# Patient Record
Sex: Female | Born: 1990 | Race: Black or African American | Hispanic: No | Marital: Single | Smoking: Never smoker
Health system: Southern US, Community
[De-identification: ages and names within clinical notes are randomized; demographics above are authoritative.]

## PROBLEM LIST (undated history)

## (undated) DIAGNOSIS — F32A Depression, unspecified: Secondary | ICD-10-CM

## (undated) DIAGNOSIS — F909 Attention-deficit hyperactivity disorder, unspecified type: Secondary | ICD-10-CM

## (undated) DIAGNOSIS — F329 Major depressive disorder, single episode, unspecified: Secondary | ICD-10-CM

## (undated) HISTORY — PX: KNEE SURGERY: SHX244

---

## 2014-03-05 ENCOUNTER — Ambulatory Visit (INDEPENDENT_AMBULATORY_CARE_PROVIDER_SITE_OTHER): Payer: Managed Care, Other (non HMO)

## 2014-03-05 ENCOUNTER — Encounter: Payer: Self-pay | Admitting: Physician Assistant

## 2014-03-05 ENCOUNTER — Ambulatory Visit (INDEPENDENT_AMBULATORY_CARE_PROVIDER_SITE_OTHER): Payer: Managed Care, Other (non HMO) | Admitting: Physician Assistant

## 2014-03-05 ENCOUNTER — Other Ambulatory Visit: Payer: Self-pay | Admitting: Physician Assistant

## 2014-03-05 ENCOUNTER — Ambulatory Visit: Payer: Managed Care, Other (non HMO)

## 2014-03-05 VITALS — BP 114/73 | HR 77 | Ht 62.0 in | Wt 145.0 lb

## 2014-03-05 DIAGNOSIS — M25561 Pain in right knee: Secondary | ICD-10-CM

## 2014-03-05 DIAGNOSIS — M25569 Pain in unspecified knee: Secondary | ICD-10-CM

## 2014-03-05 DIAGNOSIS — S76119A Strain of unspecified quadriceps muscle, fascia and tendon, initial encounter: Secondary | ICD-10-CM

## 2014-03-05 MED ORDER — MELOXICAM 15 MG PO TABS: ORAL_TABLET | ORAL | Status: DC

## 2014-03-05 NOTE — Patient Instructions (Addendum)
Start mobic daily.  Out of Physical training for 2 weeks.  Will get set up with PT.  Follow up with Dr. Karie Schwalbe in 2 weeks.   Quadriceps Strain with Rehab A strain is a tear in a muscle or the tendon that attaches the muscle to bone. A quadriceps strain is a tear in the muscles on the front of the thigh (quadriceps muscles) or their tendons. The quadriceps muscles are important for straightening the knee and bending the hip. The condition is characterized by pain, inflammation, and reduced function of these muscles. Strains are classified into three categories. Grade 1 strains cause pain, but the tendon is not lengthened. Grade 2 strains include a lengthened ligament due to the ligament being stretched or partially ruptured. With grade 2 strains there is still function, although the function may be diminished. Grade 3 strains are characterized by a complete tear of the tendon or muscle, and function is usually impaired.  SYMPTOMS   Pain, tenderness, inflammation, and/or bruising (contusion) over the quadriceps muscles  Pain that worsens with use of the quadriceps muscles.  Muscle spasm in the thigh.  Difficulty with common tasks that involve the quadriceps muscle, such as walking.  A crackling sound (crepitation) when the tendon is moved or touched.  Loss of fullness of the muscle or bulging within the area of muscle with complete rupture. CAUSES  A strain occurs when a force is placed on the muscle or tendon that is greater than it can withstand. Common mechanisms of injury include:  Repetitive strenuous use of the quadriceps muscles. This may be due to an increase in the intensity, frequency, or duration of exercise.  Direct trauma to the quadriceps muscles or tendons. RISK INCREASES WITH:  Activities that involve forceful contractions of the quadriceps muscles (jumping or sprinting).  Contact sports (soccer or football).  Poor strength and flexibility.  Failure to warm-up properly  before activity.  Previous injury to the thigh or knee. PREVENTION  Warm up and stretch properly before activity.  Allow for adequate recovery between workouts.  Maintain physical fitness:  Strength, flexibility, and endurance.  Cardiovascular fitness.  Wear properly fitted and padded protective equipment. PROGNOSIS  If treated properly, then quadriceps muscles strains are usually curable within 6 weeks.  RELATED COMPLICATIONS   Prolonged healing time, if improperly treated or re-injured.  Recurrent symptoms that result in a chronic problem.  Recurrence of symptoms if activity is resumed too soon. TREATMENT  Treatment initially involves the use of ice and medication to help reduce pain and inflammation. The use of strengthening and stretching exercises may help reduce pain with activity. These exercises may be performed at home or with referral to a therapist. Crutches may be recommended to allow the muscle to rest until walking can be completed without limping. Surgery is rarely necessary for this injury, but may be considered if the injury involves a grade 3 strain, or if symptoms persist for greater than 3 months despite non-surgical (conservative) treatment.  MEDICATION  If pain medication is necessary, then nonsteroidal anti-inflammatory medications, such as aspirin and ibuprofen, or other minor pain relievers, such as acetaminophen, are often recommended.  Do not take pain medication for 7 days before surgery.  Prescription pain relievers may be given if deemed necessary by your caregiver. Use only as directed and only as much as you need.  Ointments applied to the skin may be helpful.  Corticosteroid injections may be given by your caregiver. These injections should be reserved for the most  serious cases, because they may only be given a certain number of times. HEAT AND COLD  Cold treatment (icing) relieves pain and reduces inflammation. Cold treatment should be  applied for 10 to 15 minutes every 2 to 3 hours for inflammation and pain and immediately after any activity that aggravates your symptoms. Use ice packs or massage the area with a piece of ice (ice massage).  Heat treatment may be used prior to performing the stretching and strengthening activities prescribed by your caregiver, physical therapist, or athletic trainer. Use a heat pack or soak the injury in warm water. SEEK MEDICAL CARE IF:  Treatment seems to offer no benefit, or the condition worsens.  Any medications produce adverse side effects. EXERCISES  RANGE OF MOTION (ROM) AND STRETCHING EXERCISES - Quadriceps Strain These exercises may help you when beginning to rehabilitate your injury. Your symptoms may resolve with or without further involvement from your physician, physical therapist or athletic trainer. While completing these exercises, remember:   Restoring tissue flexibility helps normal motion to return to the joints. This allows healthier, less painful movement and activity.  An effective stretch should be held for at least 30 seconds.  A stretch should never be painful. You should only feel a gentle lengthening or release in the stretched tissue. RANGE OF MOTION - Knee Flexion, Active  Lie on your back with both knees straight. (If this causes back discomfort, bend your opposite knee, placing your foot flat on the floor.)  Slowly slide your heel back toward your buttocks until you feel a gentle stretch in the front of your knee or thigh.  Hold for __________ seconds. Slowly slide your heel back to the starting position. Repeat __________ times. Complete this exercise __________ times per day.  STRETCH - Quadriceps, Prone  Lie on your stomach on a firm surface, such as a bed or padded floor.  Bend your right / left knee and grasp your ankle. If you are unable to reach, your ankle or pant leg, use a belt around your foot to lengthen your reach.  Gently pull your heel  toward your buttocks. Your knee should not slide out to the side. You should feel a stretch in the front of your thigh and/or knee.  Hold this position for __________ seconds. Repeat __________ times. Complete this stretch __________ times per day.  STRETCHING - Hip Flexors, Lunge  Half kneel with your right / left knee on the floor and your opposite knee bent and directly over your ankle.  Keep good posture with your head over your shoulders. Tighten your buttocks to point your tailbone downward; this will prevent your back from arching too much.  You should feel a gentle stretch in the front of your thigh and/or hip. If you do not feel any resistance, slightly slide your opposite foot forward and then slowly lunge forward so your knee once again lines up over your ankle. Be sure your tailbone remains pointed downward.  Hold this stretch for __________ seconds. Repeat __________ times. Complete this stretch __________ times per day. STRENGTHENING EXERCISES - Quadriceps Strain These exercises may help you when beginning to rehabilitate your injury. They may resolve your symptoms with or without further involvement from your physician, physical therapist or athletic trainer. While completing these exercises, remember:   Muscles can gain both the endurance and the strength needed for everyday activities through controlled exercises.  Complete these exercises as instructed by your physician, physical therapist or athletic trainer. Progress the resistance and repetitions  only as guided. STRENGTH - Quadriceps, Isometrics  Lie on your back with your right / left leg extended and your opposite knee bent.  Gradually tense the muscles in the front of your right / left thigh. You should see either your knee cap slide up toward your hip or increased dimpling just above the knee. This motion will push the back of the knee down toward the floor/mat/bed on which you are lying.  Hold the muscle as tight  as you can without increasing your pain for __________ seconds.  Relax the muscles slowly and completely in between each repetition. Repeat __________ times. Complete this exercise __________ times per day.  STRENGTH - Quadriceps, Short Arcs   Lie on your back. Place a __________ inch towel roll under your knee so that the knee slightly bends.  Raise only your lower leg by tightening the muscles in the front of your thigh. Do not allow your thigh to rise.  Hold this position for __________ seconds. Repeat __________ times. Complete this exercise __________ times per day.  OPTIONAL ANKLE WEIGHTS: Begin with ____________________, but DO NOT exceed ____________________. Increase in1 lb/0.5 kg increments. STRENGTH - Quadriceps, Straight Leg Raises  Quality counts! Watch for signs that the quadriceps muscle is working to insure you are strengthening the correct muscles and not "cheating" by substituting with healthier muscles.  Lay on your back with your right / left leg extended and your opposite knee bent.  Tense the muscles in the front of your right / left thigh. You should see either your knee cap slide up or increased dimpling just above the knee. Your thigh may even quiver.  Tighten these muscles even more and raise your leg 4 to 6 inches off the floor. Hold for __________ seconds.  Keeping these muscles tense, lower your leg.  Relax the muscles slowly and completely in between each repetition. Repeat __________ times. Complete this exercise __________ times per day.  STRENGTH - Quadriceps, Wall Slides  Follow guidelines for form closely. Increased knee pain often results from poorly placed feet or knees.  Lean against a smooth wall or door and walk your feet out 18-24 inches. Place your feet hip-width apart.  Slowly slide down the wall or door until your knees bend __________ degrees.* Keep your knees over your heels, not your toes, and in line with your hips, not falling to either  side.  Hold for __________ seconds. Stand up to rest for __________ seconds in between each repetition. Repeat __________ times. Complete this exercise __________ times per day. * Your physician, physical therapist or athletic trainer will alter this angle based on your symptoms and progress. STRENGTH - Quadriceps, Step-Ups   Use a thick book, step or step stool that is __________ inches tall.  Holding a wall or counter for balance only, not support.  Slowly step-up with your right / left foot, keeping your knee in line with your hip and foot. Do not allow your knee to bend so far that you cannot see your toes.  Slowly unlock your knee and lower yourself to the starting position. Your muscles, not gravity, should lower you. Repeat __________ times. Complete this exercise __________ times per day. Document Released: 10/22/2005 Document Revised: 01/14/2012 Document Reviewed: 02/03/2009 [facility] Patient Information 2014 [place], [place].

## 2014-03-08 DIAGNOSIS — S76119A Strain of unspecified quadriceps muscle, fascia and tendon, initial encounter: Secondary | ICD-10-CM | POA: Insufficient documentation

## 2014-03-08 DIAGNOSIS — M25561 Pain in right knee: Secondary | ICD-10-CM | POA: Insufficient documentation

## 2014-03-08 NOTE — Progress Notes (Signed)
   Subjective:    Patient ID: Nancy Olson, female    DOB: 04-09-1991, 23 y.o.   MRN: [id number]  HPI Pt is a 23 yo female who presents to the clinic to establish care.   . Active Ambulatory Problems    Diagnosis Date Noted  . No Active Ambulatory Problems   Resolved Ambulatory Problems    Diagnosis Date Noted  . No Resolved Ambulatory Problems   No Additional Past Medical History   .Marland Kitchen. Past Surgical History  Procedure Laterality Date  . Knee surgery Right    .Marland Kitchen. Family History  Problem Relation Age of Onset  . Sickle cell anemia Mother   . Sickle cell anemia Sister    .Marland Kitchen. History   Social History  . Marital Status: Single    Spouse Name: N/A    Number of Children: N/A  . Years of Education: N/A   Occupational History  . Not on file.   Social History Main Topics  . Smoking status: Never Smoker   . Smokeless tobacco: Not on file  . Alcohol Use: No  . Drug Use: No  . Sexual Activity: Not Currently   Other Topics Concern  . Not on file   Social History Narrative  . No narrative on file   Pt is in the [employer] and having to do a lot of running and physical training. She had surgery on her growth plate in her femur at 23 years old. She has always had some knee discomfort. Lately it has seem to worsening. No other known injury. Ibuprofen helps some. Does not seem to give way. Hurts worse with running and exercising. Unfortunately she is doing a lot of that currently with the [employer].    Review of Systems  All other systems reviewed and are negative.      Objective:   Physical Exam  Constitutional: She is oriented to person, place, and time. She appears well-developed and well-nourished.  HENT:  Head: Normocephalic and atraumatic.  Cardiovascular: Normal rate, regular rhythm and normal heart sounds.   Pulmonary/Chest: Effort normal and breath sounds normal. She has no wheezes.  Musculoskeletal:  Right knee:  NROM.  No swelling or brusing.   Strength 5/5.  Patellar reflexes 2+ and symmetric.  No pain with palpation over patella.  No joint pain to palpiation.  Pain with palpitation over medial and lateral quad muscle and over previous fracture site.  ACL, MCL, PCL, and LCL intact without laxity.  Negative mcmurrays.  Seemed to be some discomfort with apleys grind test.  Discomfort with vagus and valgus strain above knee located over quadricep muscles.    Neurological: She is alert and oriented to person, place, and time.  Skin: Skin is dry.  Psychiatric: She has a normal mood and affect. Her behavior is normal.          Assessment & Plan:  Right knee pain- Will get xrays today. Pain seems like more over quad muscle insertion points at knee. I gave home exercises to start and will get formal PT to strengthen quad muscles. I wrote pt out of PT this weekend for [employer]. I gave mobic to start for inflammation and pain daily. recommened stabilzing knee brace with running and exercise. Importance of stretching before activity was discussed. Follow up with Dr. Karie Schwalbe in 2-3 weeks for if not improving.     Needs CPE. Pt aware and will make appt.

## 2014-03-09 ENCOUNTER — Telehealth: Payer: Self-pay | Admitting: *Deleted

## 2014-03-09 NOTE — Telephone Encounter (Signed)
Pt left a message wanting to make sure that she is cleared to workout without using a knee brace.  She hasn't gotten one yet but wanted to know.

## 2014-03-09 NOTE — Telephone Encounter (Signed)
Ok to work out. If continues to cause pain then stop and follow up after 2 weeks or so of PT.

## 2014-03-10 NOTE — Telephone Encounter (Signed)
Pt.notified

## 2014-03-12 ENCOUNTER — Other Ambulatory Visit (HOSPITAL_COMMUNITY)
Admission: RE | Admit: 2014-03-12 | Discharge: 2014-03-12 | Disposition: A | Discharge status: Home / Self Care | Payer: Managed Care, Other (non HMO) | Source: Ambulatory Visit | Attending: Family Medicine | Admitting: Family Medicine

## 2014-03-12 ENCOUNTER — Encounter: Payer: Self-pay | Admitting: Physician Assistant

## 2014-03-12 ENCOUNTER — Ambulatory Visit (INDEPENDENT_AMBULATORY_CARE_PROVIDER_SITE_OTHER): Payer: Managed Care, Other (non HMO) | Admitting: Physician Assistant

## 2014-03-12 VITALS — BP 113/67 | HR 75 | Ht 63.25 in | Wt 147.0 lb

## 2014-03-12 DIAGNOSIS — Z13220 Encounter for screening for lipoid disorders: Secondary | ICD-10-CM

## 2014-03-12 DIAGNOSIS — L680 Hirsutism: Secondary | ICD-10-CM

## 2014-03-12 DIAGNOSIS — N760 Acute vaginitis: Secondary | ICD-10-CM | POA: Insufficient documentation

## 2014-03-12 DIAGNOSIS — Z131 Encounter for screening for diabetes mellitus: Secondary | ICD-10-CM

## 2014-03-12 DIAGNOSIS — Z1151 Encounter for screening for human papillomavirus (HPV): Secondary | ICD-10-CM | POA: Insufficient documentation

## 2014-03-12 DIAGNOSIS — Z0000 Encounter for general adult medical examination without abnormal findings: Secondary | ICD-10-CM

## 2014-03-12 DIAGNOSIS — Z113 Encounter for screening for infections with a predominantly sexual mode of transmission: Secondary | ICD-10-CM | POA: Insufficient documentation

## 2014-03-12 DIAGNOSIS — R87810 Cervical high risk human papillomavirus (HPV) DNA test positive: Secondary | ICD-10-CM | POA: Insufficient documentation

## 2014-03-12 DIAGNOSIS — Z124 Encounter for screening for malignant neoplasm of cervix: Secondary | ICD-10-CM | POA: Insufficient documentation

## 2014-03-12 DIAGNOSIS — Z23 Encounter for immunization: Secondary | ICD-10-CM

## 2014-03-12 MED ORDER — MELOXICAM 15 MG PO TABS: ORAL_TABLET | ORAL | Status: DC

## 2014-03-12 NOTE — Patient Instructions (Signed)

## 2014-03-13 LAB — LIPID PANEL
Cholesterol: 190 mg/dL (ref 0–200)
HDL: 67 mg/dL (ref >39)
LDL CALC: 109 mg/dL — AB (ref 0–99)
Total CHOL/HDL Ratio: 2.8 Ratio
Triglycerides: 69 mg/dL (ref <150)
VLDL: 14 mg/dL (ref 0–40)

## 2014-03-13 LAB — COMPLETE METABOLIC PANEL WITH GFR
ALT: 8 U/L (ref 0–35)
AST: 14 U/L (ref 0–37)
Albumin: 4 g/dL (ref 3.5–5.2)
Alkaline Phosphatase: 51 U/L (ref 39–117)
BILIRUBIN TOTAL: 0.4 mg/dL (ref 0.2–1.2)
BUN: 8 mg/dL (ref 6–23)
CO2: 26 meq/L (ref 19–32)
Calcium: 8.9 mg/dL (ref 8.4–10.5)
Chloride: 103 mEq/L (ref 96–112)
Creat: 0.73 mg/dL (ref 0.50–1.10)
GLUCOSE: 79 mg/dL (ref 70–99)
Potassium: 3.6 mEq/L (ref 3.5–5.3)
SODIUM: 138 meq/L (ref 135–145)
TOTAL PROTEIN: 7.1 g/dL (ref 6.0–8.3)

## 2014-03-15 ENCOUNTER — Encounter: Payer: Self-pay | Admitting: Sports Medicine

## 2014-03-15 ENCOUNTER — Ambulatory Visit (INDEPENDENT_AMBULATORY_CARE_PROVIDER_SITE_OTHER): Payer: Managed Care, Other (non HMO) | Admitting: Sports Medicine

## 2014-03-15 VITALS — BP 116/69 | HR 80 | Ht 62.0 in | Wt 149.0 lb

## 2014-03-15 DIAGNOSIS — M25569 Pain in unspecified knee: Secondary | ICD-10-CM

## 2014-03-15 DIAGNOSIS — M25561 Pain in right knee: Secondary | ICD-10-CM

## 2014-03-15 DIAGNOSIS — L680 Hirsutism: Secondary | ICD-10-CM | POA: Insufficient documentation

## 2014-03-15 LAB — TESTOSTERONE, FREE, TOTAL, SHBG
Sex Hormone Binding: 46 nmol/L (ref 18–114)
TESTOSTERONE FREE: 11.2 pg/mL — AB (ref 0.6–6.8)
TESTOSTERONE: 76 ng/dL — AB (ref 10–70)
Testosterone-% Free: 1.5 % (ref 0.4–2.4)

## 2014-03-15 NOTE — Progress Notes (Signed)
Patient ID: Nancy Olson, female   DOB: 1991-08-19, 23 y.o.   MRN: [id number]   Subjective:    I'm seeing this patient as a consultation for:  Tandy GawJade Breeback, PA-C  CC: Right knee pain  HPI:  Pt is a 23 yo woman with PMH of right femoral fracture, s/p ORIF as a child, who presents today with 1 month of right knee pain.  The pain began gradually and is achy in nature.  It occurs all throughout her knee, however is worse over the medial femoral condyle.  The pain is made worse with squatting, climbing stairs, long periods of sitting, and rising from sitting.  The pain is also a lot worse after long periods of activity.  She is in the [employer] and frequently goes on 6+ mile marches, which she states exacerbate her symptoms.  She denies any recent trauma, fever, chills, erythema over the joint. Does endorse some right knee swelling.   Past medical history, Surgical history, Family history not pertinant except as noted below, Social history, Allergies, and medications have been entered into the medical record, reviewed, and no changes needed.   Review of Systems: No headache, visual changes, nausea, vomiting, diarrhea, constipation, dizziness, abdominal pain, skin rash, fevers, chills, night sweats, weight loss, swollen lymph nodes, body aches, joint swelling, muscle aches, chest pain, shortness of breath, mood changes, visual or auditory hallucinations.   Objective:   General: Well Developed, well nourished, and in no acute distress.  Neuro/Psych: Alert and oriented x3, extra-ocular muscles intact, able to move all 4 extremities, sensation grossly intact. Skin: Warm and dry, no rashes noted.  Respiratory: Not using accessory muscles, speaking in full sentences, trachea midline.  Cardiovascular: Pulses palpable, no extremity edema. Abdomen: Does not appear distended. Right Knee: Normal to inspection with no erythema or effusion or obvious bony abnormalities. Point tenderness over  medial femoral condyle and under the medial patellar facet. ROM full in flexion and extension and lower leg rotation. Ligaments with solid consistent endpoints including ACL, PCL, LCL, MCL. Negative Mcmurray's, Apley's, and Thessalonian tests. Mildly painful patellar compression. Patellar glide without crepitus. Patellar and quadriceps tendons unremarkable. Hamstring and quadriceps strength is normal.   X-rays reviewed, overall unremarkable with the exception of what appears to be a slight abnormality along the lateral aspect of the medial femoral condyle on the LenoirRosenberg view.  Impression and Recommendations:   This case required medical decision making of moderate complexity.  This is an active 23 yo female with a PMH of femoral fracture who presents with 1 month diffuse knee pain worse with activity.  X-rays demonstrate that this is not a complication of her previous ORIF and implanted hardware malfunction.  However the x-ray did show what is suspicious for a minor deformity of the femoral condyle where the patient is endorsing the worst pain.  For this reason, the patient could have an osteochondral defect of the medial epicondyle although we cannot be 100% without advanced imaging.  She also endorses symptoms consistent with patellofemoral syndrome.  Will MRI knee for further evaluation given her 1 month history of failed conservative therapy.  - MRI right knee -continue mobic

## 2014-03-15 NOTE — Progress Notes (Signed)
  Subjective:     Nancy Olson is a 23 y.o. female and is here for a comprehensive physical exam. The patient reports problems - noticed hair growth under chin. Marland Kitchen.  History   Social History  . Marital Status: Single    Spouse Name: N/A    Number of Children: N/A  . Years of Education: N/A   Occupational History  . Not on file.   Social History Main Topics  . Smoking status: Never Smoker   . Smokeless tobacco: Not on file  . Alcohol Use: No  . Drug Use: No  . Sexual Activity: Not Currently   Other Topics Concern  . Not on file   Social History Narrative  . No narrative on file   Health Maintenance  Topic Date Due  . Influenza Vaccine  06/05/2014  . Pap Smear  03/12/2017  . Tetanus/tdap  03/12/2024    The following portions of the patient's history were reviewed and updated as appropriate: allergies, current medications, past family history, past medical history, past social history, past surgical history and problem list.  Review of Systems A comprehensive review of systems was negative.   Objective:    BP 113/67  Pulse 75  Ht 5' 3.25" (1.607 m)  Wt 147 lb (66.679 kg)  BMI 25.82 kg/m2  LMP 02/24/2014 General appearance: alert and cooperative Head: Normocephalic, without obvious abnormality, atraumatic Eyes: conjunctivae/corneas clear. PERRL, EOM's intact. Fundi benign. Ears: normal TM's and external ear canals both ears Nose: Nares normal. Septum midline. Mucosa normal. No drainage or sinus tenderness. Throat: lips, mucosa, and tongue normal; teeth and gums normal Neck: no adenopathy, no carotid bruit, no JVD, supple, symmetrical, trachea midline and thyroid not enlarged, symmetric, no tenderness/mass/nodules Back: symmetric, no curvature. ROM normal. No CVA tenderness. Lungs: clear to auscultation bilaterally Breasts: normal appearance, no masses or tenderness Heart: regular rate and rhythm, S1, S2 normal, no murmur, click, rub or gallop Abdomen: soft,  non-tender; bowel sounds normal; no masses,  no organomegaly Pelvic: cervix normal in appearance, external genitalia normal, no adnexal masses or tenderness, no cervical motion tenderness, uterus normal size, shape, and consistency and vagina normal without discharge Extremities: extremities normal, atraumatic, no cyanosis or edema Pulses: 1+ symetric Skin: Skin color, texture, turgor normal. No rashes or lesions Lymph nodes: Cervical, supraclavicular, and axillary nodes normal. Neurologic: Grossly normal    Assessment:    Healthy female exam.      Plan:    cpe-Pap done today with STD testing. Tdap given. Fasting labs ordered. Gave HO. Calcium and vitamin D were recommended.   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 Encouraged to wax and not shave. This was mentioned on the way out the door. Will make follow up appt.  See After Visit Summary for Counseling Recommendations

## 2014-03-15 NOTE — Assessment & Plan Note (Addendum)
There is a significant amount pain over the medial femoral condyle, I do see a defect on x-ray on the lateral aspect of the medial femoral condyle. Pain has also been present for a month despite NSAIDs. At this point it is difficult to tell whether her pain is related to patellofemoral syndrome versus a defect along the medial Femoral condyle such as an osteochondral dissecans lesion. We are going to proceed with an MRI, I like to see her back to go over the results. [employer] and I do not want her marching, she did tell me she has a pole dancing class coming up, which I think it is okay for her to do.

## 2014-03-16 ENCOUNTER — Other Ambulatory Visit: Payer: Self-pay | Admitting: Physician Assistant

## 2014-03-16 DIAGNOSIS — R7989 Other specified abnormal findings of blood chemistry: Secondary | ICD-10-CM

## 2014-03-16 DIAGNOSIS — L680 Hirsutism: Secondary | ICD-10-CM

## 2014-03-16 DIAGNOSIS — M25561 Pain in right knee: Secondary | ICD-10-CM

## 2014-03-18 ENCOUNTER — Other Ambulatory Visit: Payer: Self-pay

## 2014-03-18 ENCOUNTER — Telehealth: Payer: Self-pay | Admitting: *Deleted

## 2014-03-18 DIAGNOSIS — L680 Hirsutism: Secondary | ICD-10-CM

## 2014-03-18 DIAGNOSIS — E349 Endocrine disorder, unspecified: Secondary | ICD-10-CM

## 2014-03-18 NOTE — Telephone Encounter (Signed)
PA obtained for MRI RT Knee w/o contrast. Auth # [id number]. Also received [facility] from [facility]  Meyer CoryMisty Islay Polanco, LPN Pt informed to get disc when she goes for MRI and to bring it to her appt with her.  Meyer CoryMisty Burnell Matlin, LPN

## 2014-03-19 ENCOUNTER — Other Ambulatory Visit: Payer: Self-pay | Admitting: Physician Assistant

## 2014-03-19 DIAGNOSIS — R87612 Low grade squamous intraepithelial lesion on cytologic smear of cervix (LGSIL): Secondary | ICD-10-CM | POA: Insufficient documentation

## 2014-03-19 DIAGNOSIS — IMO0002 Reserved for concepts with insufficient information to code with codable children: Secondary | ICD-10-CM | POA: Insufficient documentation

## 2014-03-19 MED ORDER — METRONIDAZOLE 500 MG PO TABS: 500.0000 mg | ORAL_TABLET | Freq: Three times a day (TID) | ORAL | Status: DC

## 2014-03-23 ENCOUNTER — Telehealth: Payer: Self-pay | Admitting: *Deleted

## 2014-03-23 DIAGNOSIS — R7989 Other specified abnormal findings of blood chemistry: Secondary | ICD-10-CM

## 2014-03-24 ENCOUNTER — Encounter: Payer: Self-pay | Admitting: Sports Medicine

## 2014-03-26 ENCOUNTER — Ambulatory Visit: Payer: Managed Care, Other (non HMO) | Admitting: Sports Medicine

## 2014-03-30 ENCOUNTER — Ambulatory Visit: Payer: Managed Care, Other (non HMO) | Admitting: Sports Medicine

## 2014-04-05 ENCOUNTER — Ambulatory Visit: Payer: Managed Care, Other (non HMO) | Admitting: Sports Medicine

## 2014-04-06 ENCOUNTER — Encounter: Payer: Managed Care, Other (non HMO) | Admitting: Obstetrics & Gynecology

## 2014-04-21 ENCOUNTER — Encounter: Payer: Managed Care, Other (non HMO) | Admitting: Obstetrics & Gynecology

## 2014-04-22 ENCOUNTER — Encounter: Payer: Self-pay | Admitting: Sports Medicine

## 2014-04-22 ENCOUNTER — Telehealth: Payer: Self-pay | Admitting: Physician Assistant

## 2014-04-22 ENCOUNTER — Ambulatory Visit (INDEPENDENT_AMBULATORY_CARE_PROVIDER_SITE_OTHER): Payer: Managed Care, Other (non HMO) | Admitting: Sports Medicine

## 2014-04-22 VITALS — BP 98/62 | HR 89 | Ht 62.0 in | Wt 144.0 lb

## 2014-04-22 DIAGNOSIS — M25569 Pain in unspecified knee: Secondary | ICD-10-CM

## 2014-04-22 DIAGNOSIS — M25561 Pain in right knee: Secondary | ICD-10-CM

## 2014-04-22 NOTE — Progress Notes (Signed)
  Subjective:    CC: Followup  HPI: Right knee pain: Localized over the patella, initially we have a suspicion for patellofemoral chondromalacia, MRI subsequently was negative, but she continues to have pain under the kneecap, worse going up and down stairs, with squatting, moderate, persistent.  Past medical history, Surgical history, Family history not pertinant except as noted below, Social history, Allergies, and medications have been entered into the medical record, reviewed, and no changes needed.   Review of Systems: No fevers, chills, night sweats, weight loss, chest pain, or shortness of breath.   Objective:    General: Well Developed, well nourished, and in no acute distress.  Neuro: Alert and oriented x3, extra-ocular muscles intact, sensation grossly intact.  HEENT: Normocephalic, atraumatic, pupils equal round reactive to light, neck supple, no masses, no lymphadenopathy, thyroid nonpalpable.  Skin: Warm and dry, no rashes. Cardiac: Regular rate and rhythm, no murmurs rubs or gallops, no lower extremity edema.  Respiratory: Clear to auscultation bilaterally. Not using accessory muscles, speaking in full sentences. Right Knee: Normal to inspection with no erythema or effusion or obvious bony abnormalities. Tender to palpation over the medial and lateral patellar facets. ROM full in flexion and extension and lower leg rotation. Ligaments with solid consistent endpoints including ACL, PCL, LCL, MCL. Negative Mcmurray's, Apley's, and Thessalonian tests. Non painful patellar compression. Patellar glide without crepitus. Patellar and quadriceps tendons unremarkable. Hamstring and quadriceps strength is normal.   Knee MRI was reviewed and is negative.  Impression and Recommendations:

## 2014-04-22 NOTE — Telephone Encounter (Signed)
Message copied by Jomarie LongsBREEBACK, JADE L on Thu Apr 22, 2014 11:04 PM ------      Message from: Granville LewisLARK, LORA L      Created: Thu Apr 22, 2014  3:33 PM      Regarding: NO show       FYI pt no showed or no call for colposcopy. ------

## 2014-04-22 NOTE — Telephone Encounter (Signed)
Please call pt and find out why she no showed for colposcopy today?

## 2014-04-22 NOTE — Assessment & Plan Note (Signed)
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 Formal physical therapy for 6 weeks, return to see me afterwards.

## 2014-04-27 NOTE — Telephone Encounter (Signed)
Left voicemail waiting for call back. Corliss SkainsJamie Beyounce Dickens, CMA

## 2014-05-06 ENCOUNTER — Encounter: Payer: Managed Care, Other (non HMO) | Admitting: Obstetrics & Gynecology

## 2014-05-06 DIAGNOSIS — R87619 Unspecified abnormal cytological findings in specimens from cervix uteri: Secondary | ICD-10-CM

## 2014-09-03 ENCOUNTER — Ambulatory Visit: Payer: Managed Care, Other (non HMO) | Admitting: Physician Assistant

## 2014-12-15 ENCOUNTER — Ambulatory Visit (INDEPENDENT_AMBULATORY_CARE_PROVIDER_SITE_OTHER): Payer: Managed Care, Other (non HMO)

## 2014-12-15 ENCOUNTER — Ambulatory Visit (INDEPENDENT_AMBULATORY_CARE_PROVIDER_SITE_OTHER): Payer: Managed Care, Other (non HMO) | Admitting: Physician Assistant

## 2014-12-15 ENCOUNTER — Encounter: Payer: Self-pay | Admitting: Physician Assistant

## 2014-12-15 VITALS — BP 123/58 | HR 69 | Ht 62.0 in | Wt 150.0 lb

## 2014-12-15 DIAGNOSIS — M79671 Pain in right foot: Secondary | ICD-10-CM

## 2014-12-15 DIAGNOSIS — S86891A Other injury of other muscle(s) and tendon(s) at lower leg level, right leg, initial encounter: Secondary | ICD-10-CM

## 2014-12-15 MED ORDER — MELOXICAM 15 MG PO TABS: 15.0000 mg | ORAL_TABLET | Freq: Every day | ORAL | Status: DC

## 2014-12-15 NOTE — Patient Instructions (Addendum)
Good supportive shoes.  Medial Tibial Stress Syndrome (Shin Splints) with Rehab Medial tibial stress syndrome is also called shin splints. Shin splints is a term that is broadly used to describe pain in the lower leg. Shin splints most commonly involve inflammation of the bone lining (periostitis). SYMPTOMS   Pain in the front, or more commonly, the inner part of the lower half of the leg (shin), above the ankle.  Pain that first occurs after exercise, and eventually progresses to pain at the beginning of exercise, that decreases after a short warm up period.  With continued exercise and if left untreated, constant pain that eventually causes the athlete to stop playing sports. CAUSES  Shin splints are an overuse injury, in which the bone lining (periosteum) is broken down at a faster rate than it can be repaired. This leads to inflammation of the periosteum and pain.  RISK INCREASES WITH:  Weakness or imbalance of the muscles of the leg and calf.  Poor strength and flexibility. Failure to warm up properly before activity.  Sports that require repetitive loading or running (marathon running, soccer, walking, jogging), especially on uneven ground or hard surfaces (concrete).  Lack of conditioning, early in the season or practice.  Poor running technique.  Flat feet.  Sudden change in activity intensity, frequency, or duration. PREVENTION  Warm up and stretch properly before activity.  Allow for adequate recovery between workouts.  Maintain physical fitness:  Strength, flexibility, and endurance.  Cardiovascular fitness.  Ensure properly fitted and cushioned shoes.  Wear cushioned arch supports.  Learn and use proper technique and have a coach correct improper technique.  Increase activity gradually.  Run on surfaces that absorb shock, such as grass, composite track, or sand (beach). PROGNOSIS  If treated properly with a slow return to activity, shin splints usually  heal within 2 to 8 weeks.  RELATED COMPLICATIONS   Recurring symptoms, that result in a chronic problem.  Longer healing time, if not properly treated or if not given enough time to heal.  Altered level of performance or need to end sports participation, due to pain if activity is continued without treatment. TREATMENT Treatment first involves the use of ice and medicine, to reduce pain and inflammation. The use of strengthening and stretching exercises may help reduce pain with activity. These exercises may be performed at home or with a therapist. For individuals with flat feet, the use of arch supports (orthotics) may be helpful. Sometimes, taping, casting, or bracing the leg may be advised. Slow return to activity is allowed after pain is gone. Rarely, surgery is attempted to remove the chronically inflamed tissue.  MEDICATION  If pain medicine is needed, nonsteroidal anti-inflammatory medicines (aspirin and ibuprofen), or other minor pain relievers (acetaminophen), are often advised.  Do not take pain medicine for 7 days before surgery.  Prescription pain relievers may be given, if your caregiver thinks they are needed. Use only as directed and only as much as you need.  Ointments applied to the skin may be helpful. HEAT AND COLD  Cold treatment (icing) should be applied for 10 to 15 minutes every 2 to 3 hours for inflammation and pain, and immediately after activity that aggravates your symptoms. Use ice packs or an ice massage.  Heat treatment may be used before performing stretching and strengthening activities prescribed by your caregiver, physical therapist, or athletic trainer. Use a heat pack or a warm water soak. SEEK MEDICAL CARE IF:   Symptoms get worse or do not  improve in 4 to 6 weeks, despite treatment.  New, unexplained symptoms develop. (Drugs used in treatment may produce side effects.) EXERCISES RANGE OF MOTION (ROM) AND STRETCHING EXERCISES - Medial Tibial Stress  Syndrome (Shin Splints) These exercises may help you when beginning to rehabilitate your injury. Your symptoms may resolve with or without further involvement from your physician, physical therapist or athletic trainer. While completing these exercises, remember:   Restoring tissue flexibility helps normal motion to return to the joints. This allows healthier, less painful movement and activity.  An effective stretch should be held for at least 30 seconds.  A stretch should never be painful. You should only feel a gentle lengthening or release in the stretched tissue. STRETCH - Gastroc, Standing  Place your hands on a wall.  Extend your right / left leg behind you, keeping the front knee somewhat bent.  Slightly point your toes inward on your back foot.  Keeping your right / left heel on the floor and your knee straight, shift your weight toward the wall, not allowing your back to arch.  You should feel a gentle stretch in the right / left calf. Hold this position for __________ seconds. Repeat __________ times. Complete this stretch __________ times per day. STRETCH - Soleus, Standing   Place your hands on a wall.  Extend your right / left leg behind you, keeping the other knee somewhat bent.  Slightly point your toes inward on your back foot.  Keep your right / left heel on the floor, bend your back knee, and slightly shift your weight over the back leg so that you feel a gentle stretch deep in your back calf.  Hold this position for __________ seconds. Repeat __________ times. Complete this stretch __________ times per day. STRETCH - Gastrocsoleus, Standing  Note: This exercise can place a lot of stress on your foot and ankle. Please complete this exercise only if specifically instructed by your caregiver.   Place the ball of your right / left foot on a step, keeping your other foot firmly on the same step.  Hold on to the wall or a rail for balance.  Slowly lift your other  foot, allowing your body weight to press your heel down over the edge of the step.  You should feel a stretch in your right / left calf.  Hold this position for __________ seconds.  Repeat this exercise with a slight bend in your right / left knee. Repeat __________ times. Complete this stretch __________ times per day.  RANGE OF MOTION - Ankle Eversion   Sit with your right / left ankle crossed over your opposite knee.  Grip your foot with your opposite hand, placing your thumb on the top of your foot and your fingers across the bottom of your foot.  Gently push your foot downward with a slight rotation so your littlest toes rise slightly toward the ceiling.  You should feel a gentle stretch on the inside of your ankle. Hold the stretch for __________ seconds. Repeat __________ times. Complete this exercise __________ times per day.  RANGE OF MOTION - Ankle Inversion  Sit with your right / left ankle crossed over your opposite knee.  Grip your foot with your opposite hand, placing your thumb on the bottom of your foot and your fingers across the top of your foot.  Gently pull your foot so the smallest toe comes toward you and your thumb pushes the inside of the ball of your foot away from you.  You should feel a gentle stretch on the outside of your ankle. Hold the stretch for __________ seconds. Repeat __________ times. Complete this exercise __________ times per day.  RANGE OF MOTION- Ankle Plantar Flexion   Sit with your right / left leg crossed over your opposite knee.  Use your opposite hand to pull the top of your foot and toes toward you.  You should feel a gentle stretch on the top of your foot and ankle. Hold this position for __________ seconds. Repeat __________ times. Complete __________ times per day.  STRENGTHENING EXERCISES - Medial Tibial Stress Syndrome (Shin Splints) These exercises may help you when beginning to rehabilitate your injury. They may resolve your  symptoms with or without further involvement from your physician, physical therapist or athletic trainer. While completing these exercises, remember:   Muscles can gain both the endurance and the strength needed for everyday activities through controlled exercises.  Complete these exercises as instructed by your physician, physical therapist or athletic trainer. Increase the resistance and repetitions only as guided by your caregiver. STRENGTH - Dorsiflexors  Secure a rubber exercise band or tubing to a fixed object (table, pole) and loop the other end around your right / left foot.  Sit on the floor facing the fixed object. The band should be slightly tense when your foot is relaxed.  Slowly draw your foot back toward you, using your ankle and toes.  Hold this position for __________ seconds. Slowly release the tension in the band, return your foot to the starting position. Repeat __________ times. Complete this exercise __________ times per day.  STRENGTH - Towel Curls  Sit in a chair, on a non-carpeted surface.  Place your foot on a towel, keeping your heel on the floor.  Pull the towel toward your heel only by curling your toes. Keep your heel on the floor.  If instructed by your physician, physical therapist or athletic trainer, you may add weight at the end of the towel. Repeat __________ times. Complete this exercise __________ times per day. STRENGTH - Ankle Inversion  Secure one end of a rubber exercise band or tubing to a fixed object (table, pole). Loop the other end around your foot, just before your toes.  Place your fists between your knees. This will focus your strengthening at your ankle.  Slowly, pull your big toe up and in, making sure the band is positioned to resist the entire motion.  Hold this position for __________ seconds.  Have your muscles resist the band, as it slowly pulls your foot back to the starting position. Repeat __________ times. Complete this  exercises __________ times per day.  Document Released: 10/22/2005 Document Revised: 01/14/2012 Document Reviewed: 02/03/2009 [facility] Patient Information 2015 [place], [place]. This information is not intended to replace advice given to you by your health care provider. Make sure you discuss any questions you have with your health care provider.   Plantar Fasciitis (Heel Spur Syndrome) with Rehab The plantar fascia is a fibrous, ligament-like, soft-tissue structure that spans the bottom of the foot. Plantar fasciitis is a condition that causes pain in the foot due to inflammation of the tissue. SYMPTOMS   Pain and tenderness on the underneath side of the foot.  Pain that worsens with standing or walking. CAUSES  Plantar fasciitis is caused by irritation and injury to the plantar fascia on the underneath side of the foot. Common mechanisms of injury include:  Direct trauma to bottom of the foot.  Damage to a small  nerve that runs under the foot where the main fascia attaches to the heel bone.  Stress placed on the plantar fascia due to bone spurs. RISK INCREASES WITH:   Activities that place stress on the plantar fascia (running, jumping, pivoting, or cutting).  Poor strength and flexibility.  Improperly fitted shoes.  Tight calf muscles.  Flat feet.  Failure to warm-up properly before activity.  Obesity. PREVENTION  Warm up and stretch properly before activity.  Allow for adequate recovery between workouts.  Maintain physical fitness:  Strength, flexibility, and endurance.  Cardiovascular fitness.  Maintain a health body weight.  Avoid stress on the plantar fascia.  Wear properly fitted shoes, including arch supports for individuals who have flat feet. PROGNOSIS  If treated properly, then the symptoms of plantar fasciitis usually resolve without surgery. However, occasionally surgery is necessary. RELATED COMPLICATIONS   Recurrent symptoms that may result in a  chronic condition.  Problems of the lower back that are caused by compensating for the injury, such as limping.  Pain or weakness of the foot during push-off following surgery.  Chronic inflammation, scarring, and partial or complete fascia tear, occurring more often from repeated injections. TREATMENT  Treatment initially involves the use of ice and medication to help reduce pain and inflammation. The use of strengthening and stretching exercises may help reduce pain with activity, especially stretches of the Achilles tendon. These exercises may be performed at home or with a therapist. Your caregiver may recommend that you use heel cups of arch supports to help reduce stress on the plantar fascia. Occasionally, corticosteroid injections are given to reduce inflammation. If symptoms persist for greater than 6 months despite non-surgical (conservative), then surgery may be recommended.  MEDICATION   If pain medication is necessary, then nonsteroidal anti-inflammatory medications, such as aspirin and ibuprofen, or other minor pain relievers, such as acetaminophen, are often recommended.  Do not take pain medication within 7 days before surgery.  Prescription pain relievers may be given if deemed necessary by your caregiver. Use only as directed and only as much as you need.  Corticosteroid injections may be given by your caregiver. These injections should be reserved for the most serious cases, because they may only be given a certain number of times. HEAT AND COLD  Cold treatment (icing) relieves pain and reduces inflammation. Cold treatment should be applied for 10 to 15 minutes every 2 to 3 hours for inflammation and pain and immediately after any activity that aggravates your symptoms. Use ice packs or massage the area with a piece of ice (ice massage).  Heat treatment may be used prior to performing the stretching and strengthening activities prescribed by your caregiver, physical  therapist, or athletic trainer. Use a heat pack or soak the injury in warm water. SEEK IMMEDIATE MEDICAL CARE IF:  Treatment seems to offer no benefit, or the condition worsens.  Any medications produce adverse side effects. EXERCISES RANGE OF MOTION (ROM) AND STRETCHING EXERCISES - Plantar Fasciitis (Heel Spur Syndrome) These exercises may help you when beginning to rehabilitate your injury. Your symptoms may resolve with or without further involvement from your physician, physical therapist or athletic trainer. While completing these exercises, remember:   Restoring tissue flexibility helps normal motion to return to the joints. This allows healthier, less painful movement and activity.  An effective stretch should be held for at least 30 seconds.  A stretch should never be painful. You should only feel a gentle lengthening or release in the stretched tissue. RANGE  OF MOTION - Toe Extension, Flexion  Sit with your right / left leg crossed over your opposite knee.  Grasp your toes and gently pull them back toward the top of your foot. You should feel a stretch on the bottom of your toes and/or foot.  Hold this stretch for __________ seconds.  Now, gently pull your toes toward the bottom of your foot. You should feel a stretch on the top of your toes and or foot.  Hold this stretch for __________ seconds. Repeat __________ times. Complete this stretch __________ times per day.  RANGE OF MOTION - Ankle Dorsiflexion, Active Assisted  Remove shoes and sit on a chair that is preferably not on a carpeted surface.  Place right / left foot under knee. Extend your opposite leg for support.  Keeping your heel down, slide your right / left foot back toward the chair until you feel a stretch at your ankle or calf. If you do not feel a stretch, slide your bottom forward to the edge of the chair, while still keeping your heel down.  Hold this stretch for __________ seconds. Repeat __________  times. Complete this stretch __________ times per day.  STRETCH - Gastroc, Standing  Place hands on wall.  Extend right / left leg, keeping the front knee somewhat bent.  Slightly point your toes inward on your back foot.  Keeping your right / left heel on the floor and your knee straight, shift your weight toward the wall, not allowing your back to arch.  You should feel a gentle stretch in the right / left calf. Hold this position for __________ seconds. Repeat __________ times. Complete this stretch __________ times per day. STRETCH - Soleus, Standing  Place hands on wall.  Extend right / left leg, keeping the other knee somewhat bent.  Slightly point your toes inward on your back foot.  Keep your right / left heel on the floor, bend your back knee, and slightly shift your weight over the back leg so that you feel a gentle stretch deep in your back calf.  Hold this position for __________ seconds. Repeat __________ times. Complete this stretch __________ times per day. STRETCH - Gastrocsoleus, Standing  Note: This exercise can place a lot of stress on your foot and ankle. Please complete this exercise only if specifically instructed by your caregiver.   Place the ball of your right / left foot on a step, keeping your other foot firmly on the same step.  Hold on to the wall or a rail for balance.  Slowly lift your other foot, allowing your body weight to press your heel down over the edge of the step.  You should feel a stretch in your right / left calf.  Hold this position for __________ seconds.  Repeat this exercise with a slight bend in your right / left knee. Repeat __________ times. Complete this stretch __________ times per day.  STRENGTHENING EXERCISES - Plantar Fasciitis (Heel Spur Syndrome)  These exercises may help you when beginning to rehabilitate your injury. They may resolve your symptoms with or without further involvement from your physician, physical  therapist or athletic trainer. While completing these exercises, remember:   Muscles can gain both the endurance and the strength needed for everyday activities through controlled exercises.  Complete these exercises as instructed by your physician, physical therapist or athletic trainer. Progress the resistance and repetitions only as guided. STRENGTH - Towel Curls  Sit in a chair positioned on a non-carpeted surface.  Place your foot on a towel, keeping your heel on the floor.  Pull the towel toward your heel by only curling your toes. Keep your heel on the floor.  If instructed by your physician, physical therapist or athletic trainer, add ____________________ at the end of the towel. Repeat __________ times. Complete this exercise __________ times per day. STRENGTH - Ankle Inversion  Secure one end of a rubber exercise band/tubing to a fixed object (table, pole). Loop the other end around your foot just before your toes.  Place your fists between your knees. This will focus your strengthening at your ankle.  Slowly, pull your big toe up and in, making sure the band/tubing is positioned to resist the entire motion.  Hold this position for __________ seconds.  Have your muscles resist the band/tubing as it slowly pulls your foot back to the starting position. Repeat __________ times. Complete this exercises __________ times per day.  Document Released: 10/22/2005 Document Revised: 01/14/2012 Document Reviewed: 02/03/2009 [facility] Patient Information 2015 [place], [place]. This information is not intended to replace advice given to you by your health care provider. Make sure you discuss any questions you have with your health care provider.

## 2014-12-16 ENCOUNTER — Telehealth: Payer: Self-pay | Admitting: *Deleted

## 2014-12-16 DIAGNOSIS — S86899A Other injury of other muscle(s) and tendon(s) at lower leg level, unspecified leg, initial encounter: Secondary | ICD-10-CM | POA: Insufficient documentation

## 2014-12-16 DIAGNOSIS — M79671 Pain in right foot: Secondary | ICD-10-CM | POA: Insufficient documentation

## 2014-12-16 DIAGNOSIS — M79672 Pain in left foot: Secondary | ICD-10-CM

## 2014-12-16 NOTE — Telephone Encounter (Signed)
Pt left vm stating that her sgt is requesting a letter stating her dx, allowance for tennis shoes, limitations, and an expected timeline.  She also wanted to know what else she could do for her foot pain.  She stated that both of her feet are bothering her.

## 2014-12-16 NOTE — Telephone Encounter (Signed)
She can schedule appt with Dr. Karie Schwalbe for possible injection and orthotics.   DX right foot pain/plantar facsittis. Needs good supportive shoes. No running for 4 weeks. Unknown timeline 2-6 weeks.

## 2014-12-16 NOTE — Progress Notes (Signed)
   Subjective:    Patient ID: Nancy Olson, female    DOB: [birth year], 24 y.o.   MRN: [id number]  HPI Pt is a 24 yo female who presents to the clinic with right foot pain and right shin pain that has been getting progressevly worse over the past month. She is in the [employer] and earlier this month had to do a lot of running. Pain seems to radiate around right lateral ankle into her mid-foot. At rest no pain. Pain occurs with walking. She has done nothing to make better. Shin pain is only after running or walking long periods.    Review of Systems  All other systems reviewed and are negative.      Objective:   Physical Exam  Musculoskeletal:  No pain over right shin today.  Normal ROM of right foot.  No pain over dorsum of foot to palpation.  Pain plantarly to palpation over mid foot. Worse with doris and plantar flexion.  No swelling.  No pain over heel.           Assessment & Plan:  Right foot pain/shin splints- sounds like some plantar fascittis. Xray was normal of right foot. Discussed wearing good supportive shoes. mobic as needed. Exercises given for both. No running for next 2-4 weeks. Not for supportive shoes at work given. If not improving follow up with Dr. Karie Schwalbe for orthotics and possible injections.

## 2014-12-20 ENCOUNTER — Encounter: Payer: Self-pay | Admitting: *Deleted

## 2014-12-20 NOTE — Telephone Encounter (Signed)
Letter written.  Pt notified & stated she has an appt with Dr. Karie Schwalbe this coming Thursday.

## 2014-12-23 ENCOUNTER — Ambulatory Visit (INDEPENDENT_AMBULATORY_CARE_PROVIDER_SITE_OTHER): Payer: Managed Care, Other (non HMO) | Admitting: Sports Medicine

## 2014-12-23 ENCOUNTER — Encounter: Payer: Self-pay | Admitting: Sports Medicine

## 2014-12-23 VITALS — BP 95/55 | HR 76 | Ht 62.0 in | Wt 150.0 lb

## 2014-12-23 DIAGNOSIS — M79671 Pain in right foot: Secondary | ICD-10-CM

## 2014-12-23 DIAGNOSIS — M79672 Pain in left foot: Secondary | ICD-10-CM

## 2014-12-23 NOTE — Assessment & Plan Note (Signed)
Tenderness along the dorsal and volar fourth metatarsal shafts in a patient doing significant running and PT with the [employer] brings up the suspicion of stress fracture. She also has an arch strain. Custom orthotics as above. Return to see me in 4 weeks, we are going to put some limitations on her PT.

## 2014-12-23 NOTE — Progress Notes (Signed)
   Subjective:    I'm seeing this patient as a consultation for:  Tandy GawJade Breeback, PA-C  CC: bilateral foot pain  HPI: This 24 year old female comes in with bilateral foot pain, right worse than left, localized over the dorsum of the midfoot, and metatarsal shafts. She is in the [employer] and has been doing a lot of PT and running. Symptoms are moderate, persistent, she was referred to me predominantly for custom orthotics.  Past medical history, Surgical history, Family history not pertinant except as noted below, Social history, Allergies, and medications have been entered into the medical record, reviewed, and no changes needed.   Review of Systems: No headache, visual changes, nausea, vomiting, diarrhea, constipation, dizziness, abdominal pain, skin rash, fevers, chills, night sweats, weight loss, swollen lymph nodes, body aches, joint swelling, muscle aches, chest pain, shortness of breath, mood changes, visual or auditory hallucinations.   Objective:   General: Well Developed, well nourished, and in no acute distress.  Neuro/Psych: Alert and oriented x3, extra-ocular muscles intact, able to move all 4 extremities, sensation grossly intact. Skin: Warm and dry, no rashes noted.  Respiratory: Not using accessory muscles, speaking in full sentences, trachea midline.  Cardiovascular: Pulses palpable, no extremity edema. Abdomen: Does not appear distended. Bilateral feet: No visible erythema or swelling. Range of motion is full in all directions. Strength is 5/5 in all directions. No hallux valgus. No pes cavus or pes planus. No abnormal callus noted. No pain over the navicular prominence, or base of fifth metatarsal. No tenderness to palpation of the calcaneal insertion of plantar fascia. No pain at the Achilles insertion. No pain over the calcaneal bursa. No pain of the retrocalcaneal bursa. Extremely tender to palpation along the mid fourth metatarsal shaft bilaterally both on  the dorsal and plantar surfaces. No hallux rigidus or limitus. No tenderness palpation over interphalangeal joints. No pain with compression of the metatarsal heads. Neurovascularly intact distally.  Patient was fitted for a : standard, cushioned, semi-rigid orthotic. The orthotic was heated and afterward the patient stood on the orthotic blank positioned on the orthotic stand. The patient was positioned in subtalar neutral position and 10 degrees of ankle dorsiflexion in a weight bearing stance. After completion of molding, a stable base was applied to the orthotic blank. The blank was ground to a stable position for weight bearing. Size: 7 Base: [facility] Additional Posting and Padding: None The patient ambulated these, and they were very comfortable.  Impression and Recommendations:   This case required medical decision making of moderate complexity.

## 2015-01-21 ENCOUNTER — Ambulatory Visit (INDEPENDENT_AMBULATORY_CARE_PROVIDER_SITE_OTHER): Payer: Managed Care, Other (non HMO) | Admitting: Sports Medicine

## 2015-01-21 ENCOUNTER — Encounter: Payer: Self-pay | Admitting: Sports Medicine

## 2015-01-21 VITALS — BP 114/77 | HR 80 | Ht 62.0 in | Wt 151.0 lb

## 2015-01-21 DIAGNOSIS — M79671 Pain in right foot: Secondary | ICD-10-CM

## 2015-01-21 DIAGNOSIS — M79672 Pain in left foot: Secondary | ICD-10-CM

## 2015-01-21 NOTE — Progress Notes (Signed)
  Subjective:    CC: Follow-up after orthotics  HPI: For sometime now this 24 year old female 40has had bilateral foot pain, right worse than left, initially pain was over the dorsum of all of the metatarsals, now it's over the plantar aspect and worse with flexion of the toes. Symptoms are moderate, persistent. We built custom orthotics, and she has not had any improvement whatsoever. Initially she described pain particularly with drills, running, and marching with the [employer].  Past medical history, Surgical history, Family history not pertinant except as noted below, Social history, Allergies, and medications have been entered into the medical record, reviewed, and no changes needed.   Review of Systems: No fevers, chills, night sweats, weight loss, chest pain, or shortness of breath.   Objective:    General: Well Developed, well nourished, and in no acute distress.  Neuro: Alert and oriented x3, extra-ocular muscles intact, sensation grossly intact.  HEENT: Normocephalic, atraumatic, pupils equal round reactive to light, neck supple, no masses, no lymphadenopathy, thyroid nonpalpable.  Skin: Warm and dry, no rashes. Cardiac: Regular rate and rhythm, no murmurs rubs or gallops, no lower extremity edema.  Respiratory: Clear to auscultation bilaterally. Not using accessory muscles, speaking in full sentences. Both feet: Tender to palpation over the entirety of the foot in nearly all bony as well as tenderness and ligamentous structures. No visible swelling. Ambulates in with a nonantalgic gait.  Short leg cast placed on the right side.  Impression and Recommendations:

## 2015-01-21 NOTE — Assessment & Plan Note (Signed)
I do suspect there is an element of malingering here. Pain is out of proportion to exam, and visible appearance of the feet. Certainly we can get a diagnosis from an MRI however patient would desire to try cast first for a couple of weeks. We will try cast if no improvement we will MRI her right foot.

## 2015-02-07 ENCOUNTER — Encounter: Payer: Self-pay | Admitting: Sports Medicine

## 2015-02-07 ENCOUNTER — Ambulatory Visit (INDEPENDENT_AMBULATORY_CARE_PROVIDER_SITE_OTHER): Payer: Managed Care, Other (non HMO) | Admitting: Sports Medicine

## 2015-02-07 DIAGNOSIS — M79671 Pain in right foot: Secondary | ICD-10-CM | POA: Diagnosis not present

## 2015-02-07 DIAGNOSIS — M79672 Pain in left foot: Secondary | ICD-10-CM | POA: Diagnosis not present

## 2015-02-07 NOTE — Progress Notes (Signed)
  Subjective:    CC: recheck cast  HPI: This is a 24 year old female who comes in after 3 weeks of cast immobilization, for migratory dorsal mid foot pain on the right side. Initially she told us she had no pain, once cast was removed she tells me she had pain.  Past medical history, Surgical history, Family history not pertinant except as noted below, Social history, Allergies, and medications have been entered into the medical record, reviewed, and no changes needed.   Review of Systems: No fevers, chills, night sweats, weight loss, chest pain, or shortness of breath.   Objective:    General: Well Developed, well nourished, and in no acute distress.  Neuro: Alert and oriented x3, extra-ocular muscles intact, sensation grossly intact.  HEENT: Normocephalic, atraumatic, pupils equal round reactive to light, neck supple, no masses, no lymphadenopathy, thyroid nonpalpable.  Skin: Warm and dry, no rashes. Cardiac: Regular rate and rhythm, no murmurs rubs or gallops, no lower extremity edema.  Respiratory: Clear to auscultation bilaterally. Not using accessory muscles, speaking in full sentences.  Cast is removed, afterwards there was relatively diffuse tenderness significantly out of proportion to degree of palpation and foot examination.  Impression and Recommendations:

## 2015-02-07 NOTE — Assessment & Plan Note (Signed)
Cast is removed today. Still with pain over the metatarsals. Appearance of foot has been normal and pain has been migratory. For this reason I do suspect an element of malingering to get out of PT. At this point we are going to MRI her foot, we will discuss results over the phone.

## 2015-02-09 ENCOUNTER — Telehealth: Payer: Self-pay | Admitting: Sports Medicine

## 2015-02-09 NOTE — Telephone Encounter (Signed)
[facility] stating the Patient would like to get her MRI done there since it would be cheaper based on her insurance's list of preferred locations. Faxed over signed MRI order and insurance authorization information for imaging to be completed.

## 2015-02-09 NOTE — Telephone Encounter (Signed)
Preesh! 

## 2015-05-17 ENCOUNTER — Encounter: Payer: Managed Care, Other (non HMO) | Admitting: Family Medicine

## 2015-05-27 ENCOUNTER — Ambulatory Visit (INDEPENDENT_AMBULATORY_CARE_PROVIDER_SITE_OTHER): Payer: Managed Care, Other (non HMO) | Admitting: Family Medicine

## 2015-05-27 ENCOUNTER — Encounter: Payer: Self-pay | Admitting: Family Medicine

## 2015-05-27 VITALS — BP 107/68 | HR 79 | Ht 62.0 in | Wt 147.0 lb

## 2015-05-27 DIAGNOSIS — R41840 Attention and concentration deficit: Secondary | ICD-10-CM

## 2015-05-27 NOTE — Progress Notes (Signed)
   Subjective:    Patient ID: Nancy Olson, female    DOB: Jul 28, 1991, 24 y.o.   MRN: [id number]  HPI She has been having easily distracted her whole life. Says not it is affecting her job and her relationships. Says she didn't do well in school but she feels like she is intelligent.  She was never tested for ADHD as a child.  Sometimes she says something or does something that she later regrets.  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  She has a hard time following through on task.  She says she is very hard on herself.     Review of Systems     Objective:   Physical Exam  Constitutional: She is oriented to person, place, and time. She appears well-developed and well-nourished.  HENT:  Head: Normocephalic and atraumatic.  Neurological: She is alert and oriented to person, place, and time.  Psychiatric: She has a normal mood and affect. Her behavior is normal.          Assessment & Plan:  ADHD- patient completed a dull ADHD- RSV-4 with adult prompts questionnaire. Her score was 50. She screened positive. We had a discussion about getting her formally evaluated for a dull ADHD over the [facility]. I prefer to have a formal evaluation on the chart before starting a chronic stimulant medication and we discussed this today. She seemed upset that I did not go ahead and write a stimulant medication for her today. I told her that we would try to get her in as soon as possible. Most appointments take a couple of weeks but it should not take much longer than that.

## 2015-06-01 IMAGING — CR DG KNEE COMPLETE 4+V*R*
4 series · 4 of 4 positions shown · non-contrast
Comparison: None.

CLINICAL DATA: Right knee pain.  History of fracture.

EXAM:
RIGHT KNEE - COMPLETE 4+ VIEW

[view not recorded (1 of 4)]
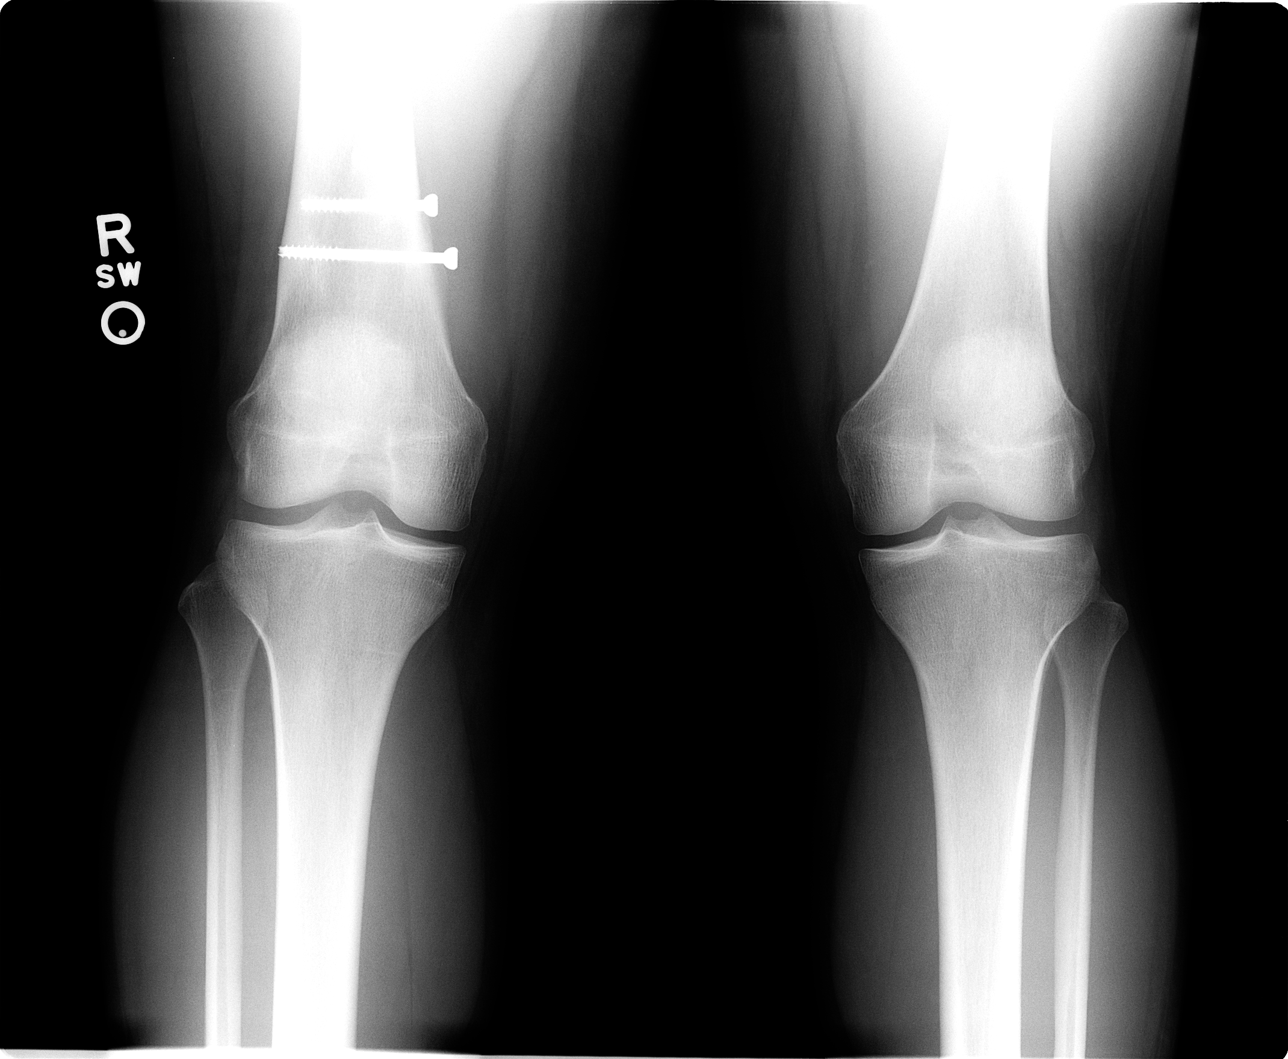

[view not recorded (2 of 4)]
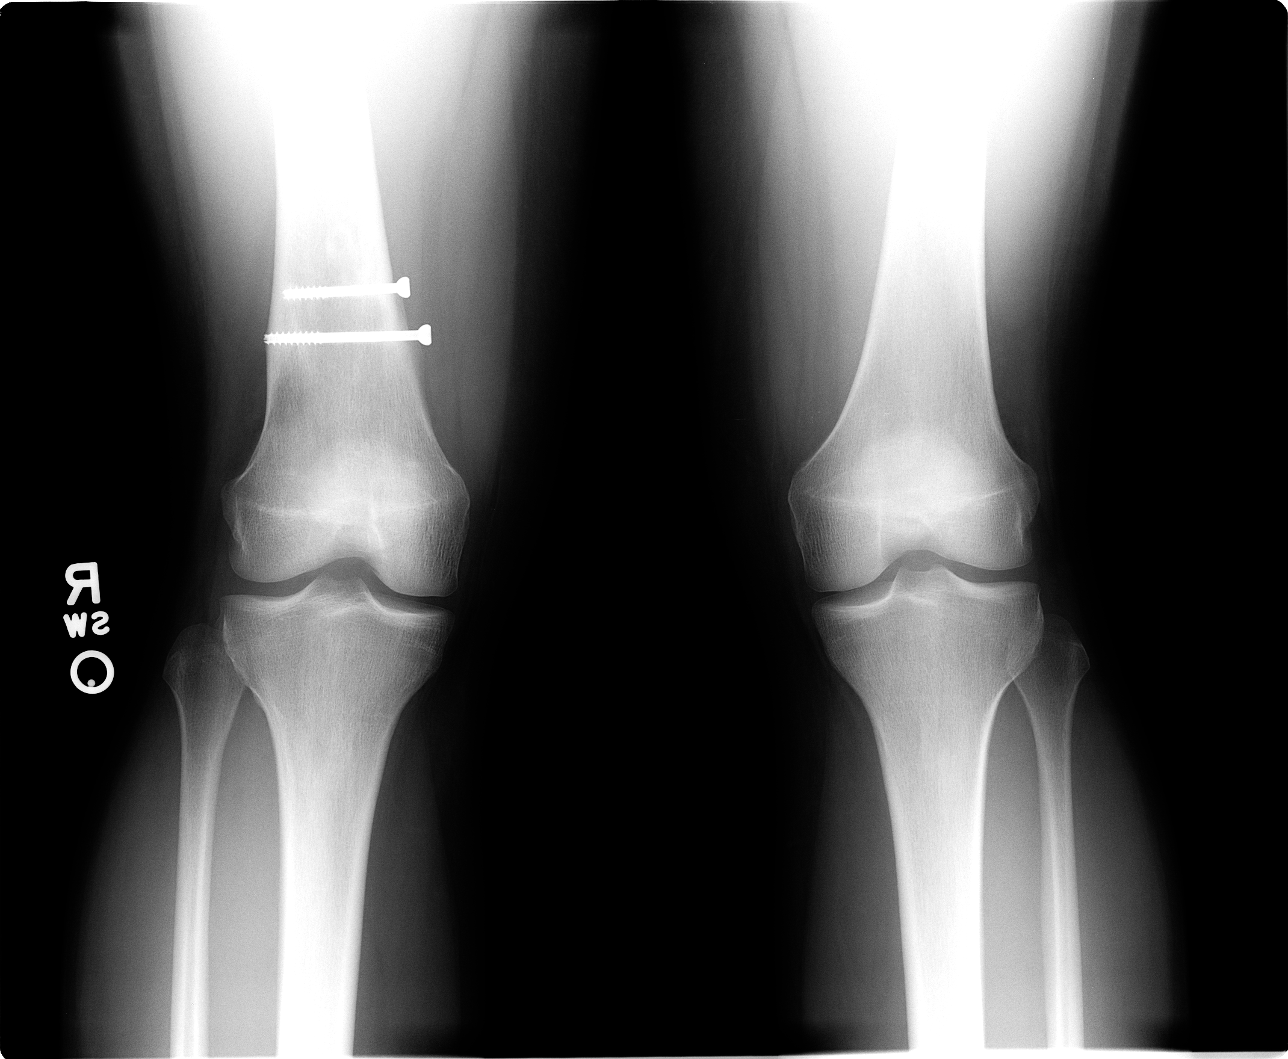

[view not recorded (3 of 4)]
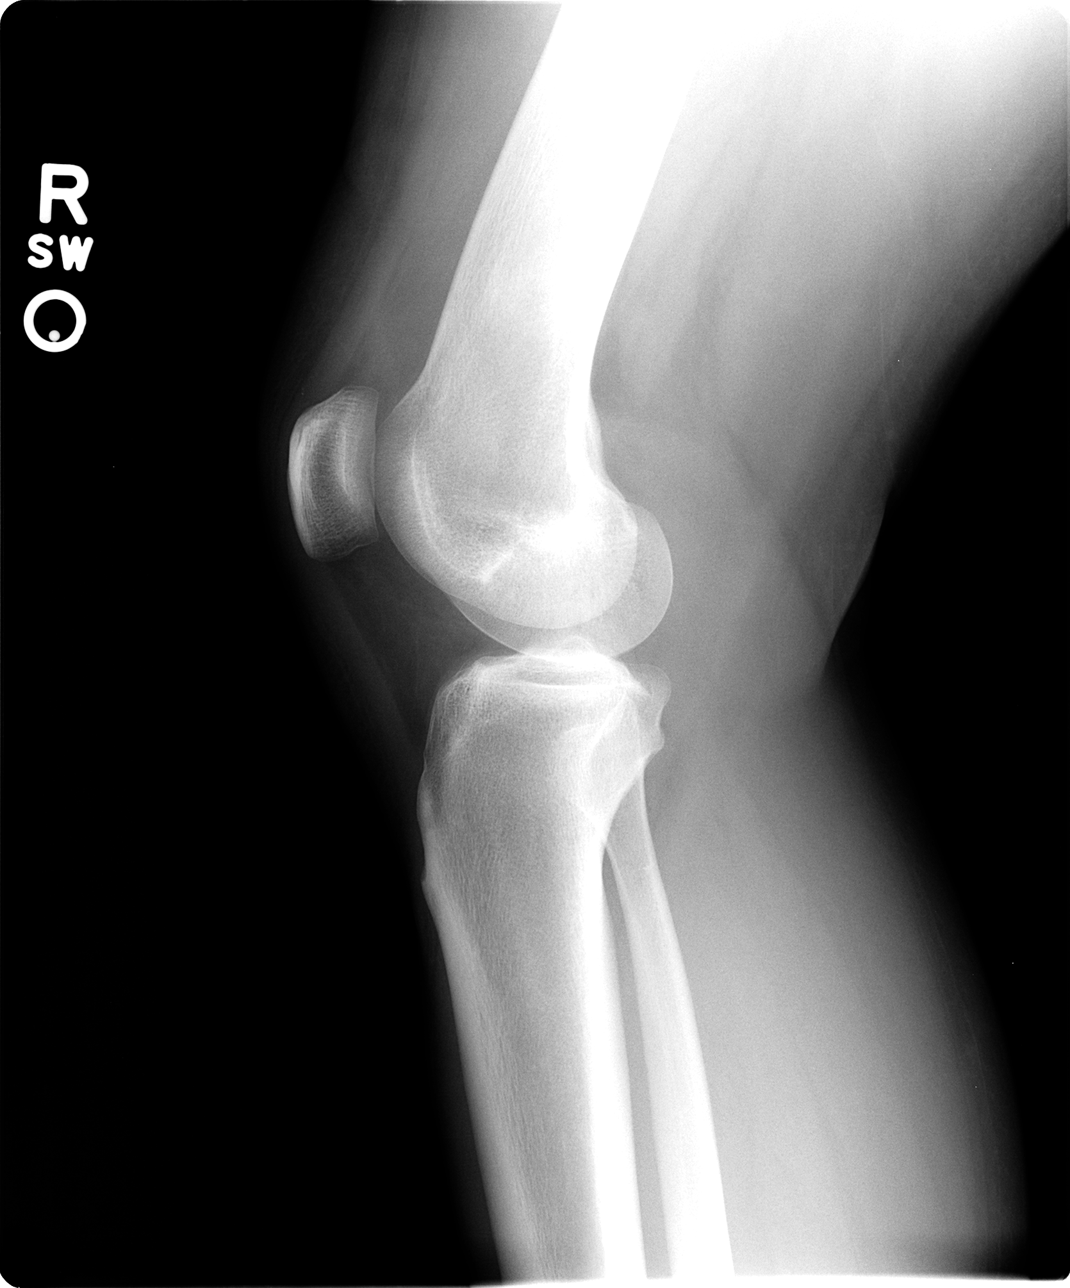

[view not recorded (4 of 4)]
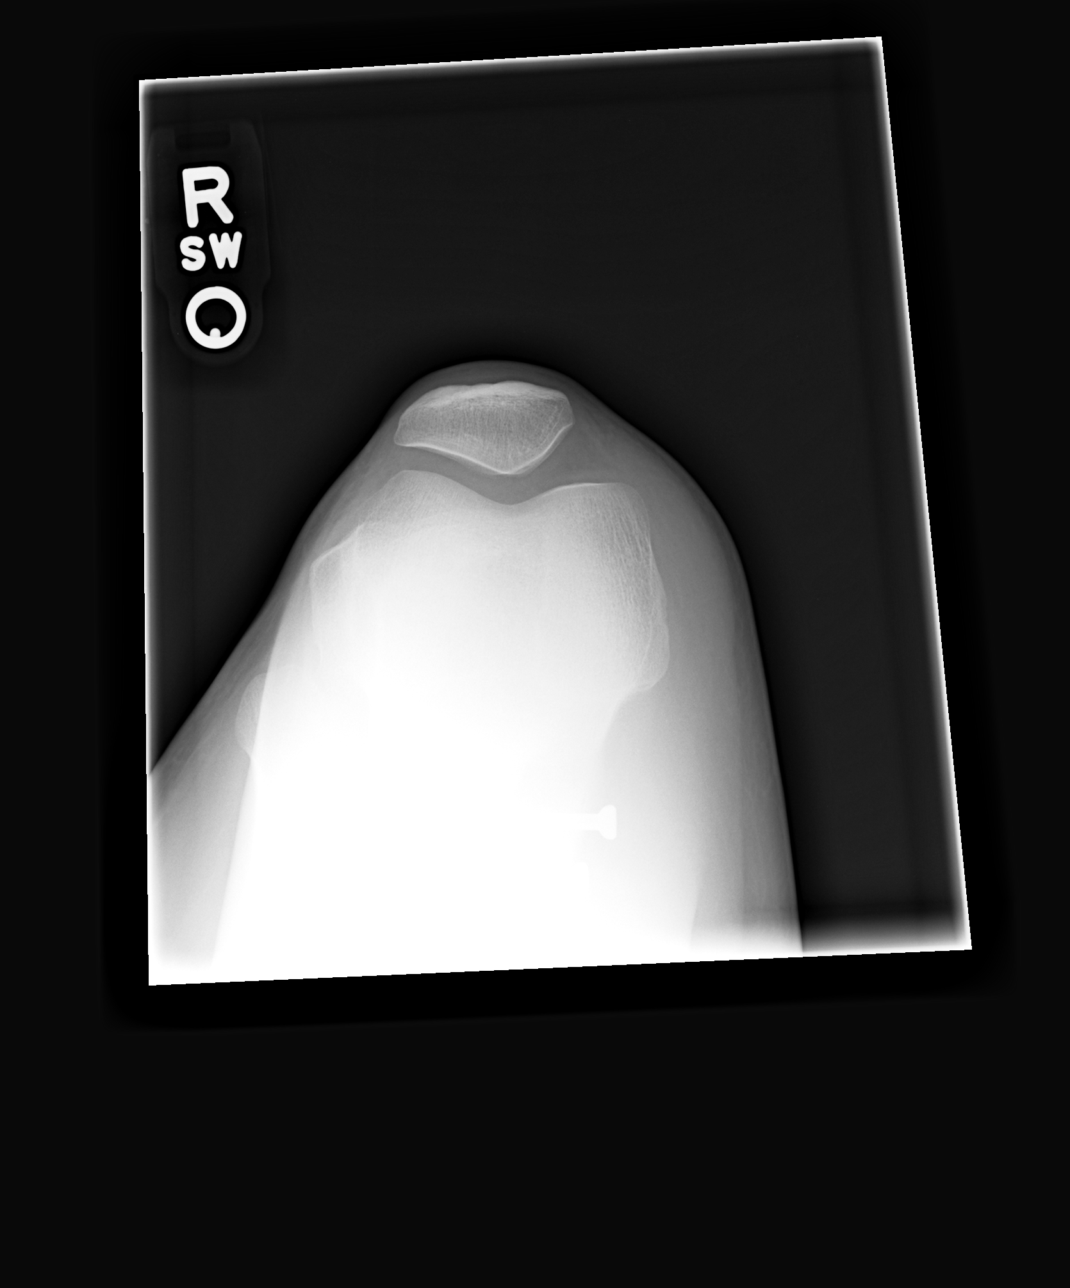

[4 of 4 positions shown; findings below may reference images not displayed]

FINDINGS: Two screws are seen in the distal femur for fracture fixation. The
fracture is healed in anatomic position and alignment. The knee
appears normal without fracture, dislocation or focal bony lesion.
Joint spaces are maintained. There is no joint effusion.
IMPRESSION: Normal appearing right knee.

Status post fixation of a distal right femur fracture without
evidence of complication.

## 2015-06-07 ENCOUNTER — Ambulatory Visit: Payer: Managed Care, Other (non HMO) | Admitting: Sports Medicine

## 2015-06-27 ENCOUNTER — Ambulatory Visit (INDEPENDENT_AMBULATORY_CARE_PROVIDER_SITE_OTHER): Payer: Managed Care, Other (non HMO) | Admitting: Psychology

## 2015-06-27 DIAGNOSIS — F4312 Post-traumatic stress disorder, chronic: Secondary | ICD-10-CM

## 2015-07-13 ENCOUNTER — Ambulatory Visit (INDEPENDENT_AMBULATORY_CARE_PROVIDER_SITE_OTHER): Payer: Managed Care, Other (non HMO) | Admitting: Psychology

## 2015-07-13 DIAGNOSIS — F4310 Post-traumatic stress disorder, unspecified: Secondary | ICD-10-CM

## 2015-07-13 DIAGNOSIS — F902 Attention-deficit hyperactivity disorder, combined type: Secondary | ICD-10-CM

## 2015-07-13 DIAGNOSIS — F331 Major depressive disorder, recurrent, moderate: Secondary | ICD-10-CM | POA: Diagnosis not present

## 2015-07-21 ENCOUNTER — Ambulatory Visit (INDEPENDENT_AMBULATORY_CARE_PROVIDER_SITE_OTHER): Payer: Managed Care, Other (non HMO) | Admitting: Psychology

## 2015-07-21 DIAGNOSIS — F331 Major depressive disorder, recurrent, moderate: Secondary | ICD-10-CM

## 2015-07-21 DIAGNOSIS — F902 Attention-deficit hyperactivity disorder, combined type: Secondary | ICD-10-CM

## 2015-07-21 DIAGNOSIS — F4310 Post-traumatic stress disorder, unspecified: Secondary | ICD-10-CM | POA: Diagnosis not present

## 2015-07-22 ENCOUNTER — Encounter: Payer: Self-pay | Admitting: Physician Assistant

## 2015-07-22 ENCOUNTER — Ambulatory Visit (INDEPENDENT_AMBULATORY_CARE_PROVIDER_SITE_OTHER): Payer: Managed Care, Other (non HMO) | Admitting: Physician Assistant

## 2015-07-22 VITALS — BP 103/58 | HR 67 | Ht 62.0 in | Wt 153.0 lb

## 2015-07-22 DIAGNOSIS — F902 Attention-deficit hyperactivity disorder, combined type: Secondary | ICD-10-CM | POA: Diagnosis not present

## 2015-07-22 DIAGNOSIS — F331 Major depressive disorder, recurrent, moderate: Secondary | ICD-10-CM

## 2015-07-22 DIAGNOSIS — F4310 Post-traumatic stress disorder, unspecified: Secondary | ICD-10-CM

## 2015-07-22 MED ORDER — AMPHETAMINE-DEXTROAMPHET ER 20 MG PO CP24: 20.0000 mg | ORAL_CAPSULE | ORAL | Status: DC

## 2015-07-22 MED ORDER — CITALOPRAM HYDROBROMIDE 10 MG PO TABS: 10.0000 mg | ORAL_TABLET | Freq: Every day | ORAL | Status: DC

## 2015-07-24 NOTE — Progress Notes (Signed)
   Subjective:    Patient ID: Nancy Olson, female    DOB: Mar 31, 1991, 24 y.o.   MRN: [id number]  HPI Pt presents to the clinic to follow up after psychology evaluation by [place]. She was dx with MDD, PTSD, and ADHD, combined type. She comes here to get a treatment plan.     Review of Systems  All other systems reviewed and are negative.      Objective:   Physical Exam  Constitutional: She appears well-developed and well-nourished.  Cardiovascular: Normal rate, regular rhythm and normal heart sounds.   Pulmonary/Chest: Effort normal and breath sounds normal.  Psychiatric: She has a normal mood and affect. Her behavior is normal.          Assessment & Plan:  MDD/PTSD- she is actively in counseling. Will start celexa today. Discussed SE's. Will take some time to get to therapetic levels. Follow up in 4-6 weeks.   ADHD- started Adderall XR  daily. Discussed SE's. Follow up in one month to check efficacy.

## 2015-08-04 ENCOUNTER — Ambulatory Visit: Payer: Managed Care, Other (non HMO) | Admitting: Psychology

## 2015-08-19 ENCOUNTER — Other Ambulatory Visit: Payer: Self-pay | Admitting: Physician Assistant

## 2015-08-19 MED ORDER — AMPHETAMINE-DEXTROAMPHET ER 20 MG PO CP24: 20.0000 mg | ORAL_CAPSULE | ORAL | Status: DC

## 2015-08-23 ENCOUNTER — Ambulatory Visit: Payer: Self-pay | Admitting: Physician Assistant

## 2015-08-24 ENCOUNTER — Emergency Department (HOSPITAL_COMMUNITY): Payer: Managed Care, Other (non HMO)

## 2015-08-24 ENCOUNTER — Encounter (HOSPITAL_COMMUNITY): Payer: Self-pay | Admitting: *Deleted

## 2015-08-24 ENCOUNTER — Emergency Department (HOSPITAL_COMMUNITY)
Admission: EM | Admit: 2015-08-24 | Discharge: 2015-08-24 | Disposition: A | Discharge status: Home / Self Care | Payer: Managed Care, Other (non HMO) | Attending: Emergency Medicine | Admitting: Emergency Medicine

## 2015-08-24 DIAGNOSIS — Z3202 Encounter for pregnancy test, result negative: Secondary | ICD-10-CM | POA: Insufficient documentation

## 2015-08-24 DIAGNOSIS — I493 Ventricular premature depolarization: Secondary | ICD-10-CM

## 2015-08-24 DIAGNOSIS — Z79899 Other long term (current) drug therapy: Secondary | ICD-10-CM | POA: Insufficient documentation

## 2015-08-24 DIAGNOSIS — R0789 Other chest pain: Secondary | ICD-10-CM | POA: Diagnosis not present

## 2015-08-24 DIAGNOSIS — R008 Other abnormalities of heart beat: Secondary | ICD-10-CM | POA: Diagnosis not present

## 2015-08-24 DIAGNOSIS — R002 Palpitations: Secondary | ICD-10-CM | POA: Diagnosis not present

## 2015-08-24 DIAGNOSIS — R079 Chest pain, unspecified: Secondary | ICD-10-CM | POA: Diagnosis present

## 2015-08-24 LAB — CBC WITH DIFFERENTIAL/PLATELET
Basophils Absolute: 0 10*3/uL (ref 0.0–0.1)
Basophils Relative: 1 %
Eosinophils Absolute: 0.1 10*3/uL (ref 0.0–0.7)
Eosinophils Relative: 2 %
HEMATOCRIT: 37.2 % (ref 36.0–46.0)
HEMOGLOBIN: 13 g/dL (ref 12.0–15.0)
LYMPHS ABS: 1.7 10*3/uL (ref 0.7–4.0)
Lymphocytes Relative: 34 %
MCH: 28.2 pg (ref 26.0–34.0)
MCHC: 34.9 g/dL (ref 30.0–36.0)
MCV: 80.7 fL (ref 78.0–100.0)
MONOS PCT: 5 %
Monocytes Absolute: 0.2 10*3/uL (ref 0.1–1.0)
NEUTROS ABS: 3 10*3/uL (ref 1.7–7.7)
NEUTROS PCT: 58 %
Platelets: 283 10*3/uL (ref 150–400)
RBC: 4.61 MIL/uL (ref 3.87–5.11)
RDW: 12.3 % (ref 11.5–15.5)
WBC: 5.1 10*3/uL (ref 4.0–10.5)

## 2015-08-24 LAB — BASIC METABOLIC PANEL
ANION GAP: 10 (ref 5–15)
BUN: 6 mg/dL (ref 6–20)
CO2: 27 mmol/L (ref 22–32)
Calcium: 9.8 mg/dL (ref 8.9–10.3)
Chloride: 102 mmol/L (ref 101–111)
Creatinine, Ser: 0.96 mg/dL (ref 0.44–1.00)
GLUCOSE: 114 mg/dL — AB (ref 65–99)
POTASSIUM: 3.4 mmol/L — AB (ref 3.5–5.1)
Sodium: 139 mmol/L (ref 135–145)

## 2015-08-24 LAB — I-STAT BETA HCG BLOOD, ED (MC, WL, AP ONLY): I-stat hCG, quantitative: <5 m[IU]/mL (ref <5)

## 2015-08-24 LAB — CBG MONITORING, ED: GLUCOSE-CAPILLARY: 129 mg/dL — AB (ref 65–99)

## 2015-08-24 LAB — I-STAT TROPONIN, ED: TROPONIN I, POC: 0 ng/mL (ref 0.00–0.08)

## 2015-08-24 NOTE — ED Notes (Signed)
[facility] Pt states she began having a "weird" feeling in her chest that felt like galloping accompanied with tightness. Pt CBG was 53 when EMS got there and they gave her oral glucose PTA and states her CBG went up to 89. Pt is a&o x4 upon arrival, pt is answering slowly and states it is due to the tightness in her chest.

## 2015-08-24 NOTE — ED Provider Notes (Signed)
CSN: [id number]     Arrival date & time 08/24/15  1630 History   First MD Initiated Contact with Patient 08/24/15 1631     Chief Complaint  Patient presents with  . Chest Pain     (Consider location/radiation/quality/duration/timing/severity/associated sxs/prior Treatment) HPI   Blood pressure 126/61, pulse 78, temperature 98.6 F (37 C), temperature source Oral, resp. rate 15, height  (1.575 m), weight 138 lb (62.596 kg), SpO2 100 %.  Nancy Olson is a 24 y.o. female complaining of chest tightness and palpitations onset just prior to arrival. Patient called 911, when EMS arrived her blood glucose was noted to be 53. Patient reports she's been having similar episodes over the last few weeks but normally they go away. She has recently started taking Adderall 20 mg X are approximately one month ago also Celexa. Patient denies any cocaine, methamphetamine use, family history of early cardiac death, exogenous estrogen, recent immobilizations, calf pain or leg swelling cough, fever, chills, suicidal ideation, homicidal ideation, taking more than her normal dose of Adderall.  History reviewed. No pertinent past medical history. Past Surgical History  Procedure Laterality Date  . Knee surgery Right    Family History  Problem Relation Age of Onset  . Sickle cell anemia Mother   . Sickle cell anemia Sister    Social History  Substance Use Topics  . Smoking status: Never Smoker   . Smokeless tobacco: None  . Alcohol Use: No   OB History    No data available     Review of Systems  10 systems reviewed and found to be negative, except as noted in the HPI.   Allergies  Review of patient's allergies indicates no known allergies.  Home Medications   Prior to Admission medications   Medication Sig Start Date End Date Taking? Authorizing Provider  amphetamine-dextroamphetamine (ADDERALL XR) 20 MG 24 hr capsule Take 1 capsule (20 mg total) by mouth every morning. 08/19/15  Yes  Monica Becton, MD  citalopram (CELEXA) 10 MG tablet Take 1 tablet (10 mg total) by mouth daily. 07/22/15  Yes Jade L Breeback, PA-C   BP 126/61 mmHg  Pulse 78  Temp(Src) 98.6 F (37 C) (Oral)  Resp 15  Ht  (1.575 m)  Wt 138 lb (62.596 kg)  BMI 25.23 kg/m2  SpO2 100% Physical Exam  Constitutional: She is oriented to person, place, and time. She appears well-developed and well-nourished. No distress.  HENT:  Head: Normocephalic.  Mouth/Throat: Oropharynx is clear and moist.  Eyes: Conjunctivae are normal.  Neck: Normal range of motion. No JVD present. No tracheal deviation present.  Cardiovascular: Normal rate, regular rhythm and intact distal pulses.   Radial pulse equal bilaterally  Pulmonary/Chest: Effort normal and breath sounds normal. No stridor. No respiratory distress. She has no wheezes. She has no rales. She exhibits no tenderness.  Abdominal: Soft. She exhibits no distension and no mass. There is no tenderness. There is no rebound and no guarding.  Musculoskeletal: Normal range of motion. She exhibits no edema or tenderness.  No calf asymmetry, superficial collaterals, palpable cords, edema, Homans sign negative bilaterally.    Neurological: She is alert and oriented to person, place, and time. No cranial nerve deficit.  Patient is slow to respond, seems distracted. Alert and oriented 3.  Follows commands, Clear, goal oriented speech, Strength is 5 out of 5x4 extremities, patient ambulates with a coordinated in nonantalgic gait. Sensation is grossly intact.   Skin: Skin is warm. She is  not diaphoretic.  Psychiatric: She has a normal mood and affect.  Nursing note and vitals reviewed.   ED Course  Procedures (including critical care time) Labs Review Labs Reviewed  BASIC METABOLIC PANEL - Abnormal; Notable for the following:    Potassium 3.4 (*)    Glucose, Bld 114 (*)    All other components within normal limits  CBG MONITORING, ED - Abnormal; Notable  for the following:    Glucose-Capillary 129 (*)    All other components within normal limits  CBC WITH DIFFERENTIAL/PLATELET  I-STAT BETA HCG BLOOD, ED ([facility], [facility], [facility] ONLY)  I-STAT TROPOININ, ED    Imaging Review Dg Chest 2 View  08/24/2015  CLINICAL DATA:  Chest pain EXAM: CHEST  2 VIEW COMPARISON:  None. FINDINGS: The heart size and mediastinal contours are within normal limits. Both lungs are clear. The visualized skeletal structures are unremarkable. IMPRESSION: No active cardiopulmonary disease. Electronically Signed   By: Natasha MeadLiviu  Pop M.D.   On: 08/24/2015 17:37   I have personally reviewed and evaluated these images and lab results as part of my medical decision-making.   EKG Interpretation None      MDM   Final diagnoses:  Atypical chest pain  Frequent unifocal PVCs    Filed Vitals:   08/24/15 1636 08/24/15 1645 08/24/15 1646 08/24/15 1647  BP:  126/61    Pulse:  78    Temp:    98.6 F (37 C)  TempSrc:    Oral  Resp:  15    Height:   5\' 2"  (1.575 m)   Weight:   138 lb (62.596 kg)   SpO2: 100% 100%      Nancy Olson is 24 y.o. female presenting with atypical chest pain. Low risk by heart score. EKG with many  PVCs. Chest x-ray negative. Pt is low risk by Wells Criteria and PERC negative. I think this may be secondary to her Adderall. Advised her to DC this, to not undergo any physical activity until she is cleared by her primary care physician who is managing the Adderall.  Discussed case with attending physician who agrees with care plan and disposition.   Evaluation does not show pathology that would require ongoing emergent intervention or inpatient treatment. Pt is hemodynamically stable and mentating appropriately. Discussed findings and plan with patient/guardian, who agrees with care plan. All questions answered. Return precautions discussed and outpatient follow up given.       Wynetta Emeryicole Jagjit Riner, PA-C 08/24/15 1928  Mancel BaleElliott Wentz, MD 08/25/15 2348

## 2015-08-24 NOTE — Discharge Instructions (Signed)
Stop taking your  Adderall, please follow closely with your primary care doctor and make them aware that you're having a side effect. They will need to manage his medication and fine-tune the dosage.  Do not go into any sporting activity until you're cleared by her primary care physician.  Please follow with your primary care doctor in the next 2 days for a check-up. They must obtain records for further management.   Do not hesitate to return to the Emergency Department for any new, worsening or concerning symptoms.

## 2015-08-24 NOTE — ED Notes (Signed)
Pt is in stable condition upon d/c and ambulates from ED. 

## 2015-08-26 ENCOUNTER — Encounter: Payer: Self-pay | Admitting: Physician Assistant

## 2015-08-26 ENCOUNTER — Ambulatory Visit (INDEPENDENT_AMBULATORY_CARE_PROVIDER_SITE_OTHER): Payer: Managed Care, Other (non HMO) | Admitting: Physician Assistant

## 2015-08-26 VITALS — BP 109/63 | HR 80 | Ht 62.0 in | Wt 139.0 lb

## 2015-08-26 DIAGNOSIS — R0789 Other chest pain: Secondary | ICD-10-CM | POA: Diagnosis not present

## 2015-08-26 DIAGNOSIS — I493 Ventricular premature depolarization: Secondary | ICD-10-CM

## 2015-08-26 NOTE — Patient Instructions (Addendum)
Premature Ventricular Contraction A premature ventricular contraction is an irregularity in the normal heart rhythm. These contractions are extra heartbeats that occur too early in the normal sequence. In most cases, these contractions are harmless and do not require treatment. CAUSES Premature ventricular contractions may occur without a known cause. In healthy people, the extra contractions may be caused by:  Smoking.  Drinking alcohol.  Caffeine.  Certain medicines.  Some illegal drugs.  Stress. Sometimes, changes in chemicals in the blood (electrolytes) can also cause premature ventricular contractions. They can also occur in people with heart diseases that cause a decrease in blood flow to the heart. SIGNS AND SYMPTOMS Premature ventricular contractions often do not cause any symptoms. In some cases, you may have a feeling of your heart beating fast or skipping a beat (palpitations). DIAGNOSIS Your health care provider will take your medical history and do a physical exam. During the exam, the health care provider will check for irregular heartbeats. Various tests may be done to help diagnose premature ventricular contractions. These tests may include:  An ECG (electrocardiogram) to monitor the electrical activity of your heart.  Holter monitor testing. A Holter monitor is a portable device that can monitor the electrical activity of your heart over longer periods of time.  Stress tests to see how exercise affects your heart rhythm.  Echocardiogram. This test uses sound waves (ultrasound) to produce an image of your heart.  Electrophysiology study. This is used to evaluate the electrical conduction system of your heart. TREATMENT Usually, no treatment is needed. You may be advised to avoid things that can trigger the premature contractions, such as caffeine or alcohol. Medicines are sometimes given if symptoms are severe or if the extra heartbeats are very frequent. Treatment may  also be needed for an underlying cause of the contractions if one is found. HOME CARE INSTRUCTIONS  Take medicines only as directed by your health care provider.  Make any lifestyle changes recommended by your health care provider. These may include:  Quitting smoking.  Avoiding or limiting caffeine or alcohol.  Exercising. Talk to your health care provider about what type of exercise is safe for you.  Trying to reduce stress.  Keep all follow-up visits with your health care provider. This is important. SEEK IMMEDIATE MEDICAL CARE IF:  You feel palpitations that are frequent or continual.  You have chest pain.  You have shortness of breath.  You have sweating for no reason.  You have nausea and vomiting.  You become light-headed or faint.   This information is not intended to replace advice given to you by your health care provider. Make sure you discuss any questions you have with your health care provider.   Document Released: 06/08/2004 Document Revised: 11/12/2014 Document Reviewed: 03/25/2014 Elsevier Interactive Patient Education [employer]2016 Elsevier Inc.   Will order stress test and follow up after.

## 2015-08-28 DIAGNOSIS — I493 Ventricular premature depolarization: Secondary | ICD-10-CM | POA: Insufficient documentation

## 2015-08-28 DIAGNOSIS — R0789 Other chest pain: Secondary | ICD-10-CM | POA: Insufficient documentation

## 2015-08-28 NOTE — Progress Notes (Signed)
   Subjective:    Patient ID: Nancy Olson, female    DOB: 25-Jul-1991, 24 y.o.   MRN: [id number]  HPI Pt presents to the clinic to follow up from ED with chest pains. EKG showed PVC's. Cardiac enzymes were negative. CXR was normal. Potassium was 3.4.  Suspected PVC from adderall. CP are better today. Continues to take adderall and celexa. No new symptoms. She occasional feels like worsens with exertion.    Review of Systems  All other systems reviewed and are negative.      Objective:   Physical Exam  Constitutional: She is oriented to person, place, and time. She appears well-developed and well-nourished.  HENT:  Head: Normocephalic and atraumatic.  Cardiovascular: Normal rate, regular rhythm and normal heart sounds.   Pulmonary/Chest: Effort normal and breath sounds normal.  Neurological: She is alert and oriented to person, place, and time.  Psychiatric: She has a normal mood and affect. Her behavior is normal.          Assessment & Plan:  PVC/atypical chest pains- discussed chest pains. Pt declined for now. Stop adderall. Continue celexa. Check TsH, cbc, iron. Recheck BMP for potassium level. Recheck in 2-4 weeks. Can consider adding back in stimulant once symptoms resolve. If start back up with stimulant will not be able to prescribe. Written out of physical actvity until 4 week recheck.

## 2015-09-06 ENCOUNTER — Telehealth: Payer: Self-pay | Admitting: Physician Assistant

## 2015-09-06 NOTE — Telephone Encounter (Signed)
Patient states she is having some shortness of breath and did have some dull chest pain when she went walking yesterday. Pt wants to wait one week of being off the Adderall and see if the symptoms get any better. Advised her to contact our office next week and let us know if she would like to proceed with the echo. Verbalized understanding.  Pt states for this weekend it should be just shooting, using heavy guns but no physical activity. Advised that should be fine. Pt states she has a note already from PCP to get her out of the required PT. No further questions.

## 2015-09-06 NOTE — Telephone Encounter (Signed)
Pt called clinic to see why she had not been scheduled for her echo yet. Will route to PCP for review, unable to see order.  Pt also states she has been off the adderall but is still having palpitations. 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 Pt wants to see if that is OK for her to do. Will route to PCP for review.

## 2015-09-06 NOTE — Telephone Encounter (Signed)
Ok I was under impression we were holding off on further testing to see if staying off stimulant would improve symptoms. If she would like to proceed with testing we can.   Are you having CP or SOB?   Shooting should be fine. it is running I would right you out of work for until further evaluated.

## 2015-09-09 ENCOUNTER — Ambulatory Visit (INDEPENDENT_AMBULATORY_CARE_PROVIDER_SITE_OTHER): Payer: Managed Care, Other (non HMO) | Admitting: Physician Assistant

## 2015-09-09 ENCOUNTER — Encounter: Payer: Self-pay | Admitting: Physician Assistant

## 2015-09-09 ENCOUNTER — Ambulatory Visit: Payer: Managed Care, Other (non HMO) | Admitting: Psychology

## 2015-09-09 VITALS — BP 99/61 | HR 75 | Ht 62.0 in | Wt 138.0 lb

## 2015-09-09 DIAGNOSIS — F902 Attention-deficit hyperactivity disorder, combined type: Secondary | ICD-10-CM

## 2015-09-09 DIAGNOSIS — I493 Ventricular premature depolarization: Secondary | ICD-10-CM

## 2015-09-09 DIAGNOSIS — F331 Major depressive disorder, recurrent, moderate: Secondary | ICD-10-CM

## 2015-09-09 DIAGNOSIS — F411 Generalized anxiety disorder: Secondary | ICD-10-CM | POA: Insufficient documentation

## 2015-09-09 DIAGNOSIS — R0789 Other chest pain: Secondary | ICD-10-CM | POA: Diagnosis not present

## 2015-09-09 MED ORDER — BUPROPION HCL ER (XL) 150 MG PO TB24: 150.0000 mg | ORAL_TABLET | Freq: Every day | ORAL | Status: DC

## 2015-09-09 MED ORDER — CITALOPRAM HYDROBROMIDE 20 MG PO TABS: 20.0000 mg | ORAL_TABLET | Freq: Every day | ORAL | Status: DC

## 2015-09-09 MED ORDER — ALPRAZOLAM 0.5 MG PO TABS: 0.5000 mg | ORAL_TABLET | Freq: Every evening | ORAL | Status: DC | PRN

## 2015-09-09 MED ORDER — METOPROLOL SUCCINATE ER 25 MG PO TB24: 25.0000 mg | ORAL_TABLET | Freq: Every day | ORAL | Status: DC

## 2015-09-09 NOTE — Progress Notes (Signed)
   Subjective:    Patient ID: Nancy Olson, female    DOB: 01-02-1991, 24 y.o.   MRN: [id number]  HPI  Patient is a 24 year old female who presents to the clinic to discuss her palpitations and worsening depression and anxiety. Yesterday about 4:30 she got off work and was uncontrollably crying. She does not know why she was crying. But as she cries her palpitations felt like they got worse. She continues to feel the palpitations and chest pain up until 6:30 this morning. She checked her heart rate and was 110 at times. She does feel a little benefit since we started the Celexa but feels like it is nowhere near enough. She denies any suicidal or homicidal thoughts. She has drilled this weekend and does not think she will be able to go because she so emotional and continues to have the palpitations. Patient did feel like she felt better when on the Adderall. We have taken her off because of the fear that it might be making her palpitations worse.      Review of Systems  All other systems reviewed and are negative.      Objective:   Physical Exam  Constitutional: She is oriented to person, place, and time. She appears well-developed and well-nourished.  HENT:  Head: Normocephalic and atraumatic.  Cardiovascular: Normal rate and normal heart sounds.   Few PVC heard on auscultation of heart today.   Pulmonary/Chest: Effort normal and breath sounds normal.  Tenderness to palpation over Left chest wall muscle.   Neurological: She is alert and oriented to person, place, and time.  Skin: Skin is dry.  Psychiatric: She has a normal mood and affect. Her behavior is normal.          Assessment & Plan:  Major Depression/ anxiety- increase Celexa to 20 mg daily. Added Wellbutrin 150 mg. Given Xanax 0.5 mg to use as needed for acute anxiety. Discussed Xanax is a controlled substance and to only use very sparingly as needed. Continue to talk with a counselor to work your way through anxiety  and depression.   ADHD- I do not want to restart stimulant at this time. We did decide to start Wellbutrin XL 150 mg once in the morning. Discussed side effect of medication. Hopefully this will offer some decrease in anxiety/depression/lack of focus.  Unifocal PvC's/atypical chest pain- will evaluate with exercise stress test and 48 hour Holter monitor. I did write patient out of drilled this weekend while we get further testing on her heart. Started metoprolol 25 mg daily. Discussed side effects. Recheck in 6 weeks.

## 2015-09-15 ENCOUNTER — Ambulatory Visit: Payer: Managed Care, Other (non HMO) | Admitting: Psychology

## 2015-09-19 ENCOUNTER — Other Ambulatory Visit: Payer: Self-pay | Admitting: Physician Assistant

## 2015-09-19 ENCOUNTER — Telehealth: Payer: Self-pay | Admitting: Physician Assistant

## 2015-09-19 ENCOUNTER — Inpatient Hospital Stay (HOSPITAL_COMMUNITY): Admission: RE | Admit: 2015-09-19 | Payer: Self-pay | Source: Ambulatory Visit

## 2015-09-19 DIAGNOSIS — I493 Ventricular premature depolarization: Secondary | ICD-10-CM

## 2015-09-19 DIAGNOSIS — R0789 Other chest pain: Secondary | ICD-10-CM

## 2015-09-19 NOTE — Telephone Encounter (Signed)
Jade    I received a call from [facility] stating that they could not do the Exercise Tolerant Test because you are not a cardiologist and the patient has not seen a cardiologist yet. I called patient and informed her that they would like for her to see a cardiologist before she has the test done and that [facility] to schedule her holter monitor. Patient was ok with all this and I told her once she had the monitor that we would probably reschedule the ETT. - CF

## 2015-09-21 ENCOUNTER — Telehealth: Payer: Self-pay

## 2015-09-21 NOTE — Telephone Encounter (Signed)
This could be the wellbutrin since we just started it? Stop wellbutrin and take benadryl 25-50mg  to help with hives. If any SOB, trouble swallowing, lip swelling follow up in office.

## 2015-09-21 NOTE — Telephone Encounter (Signed)
Patient advised.

## 2015-09-21 NOTE — Telephone Encounter (Signed)
Patient called and wants Lesly RubensteinJade to know she has a new symptom. She has broken out with hives and it is very itchy.

## 2015-09-28 ENCOUNTER — Encounter (HOSPITAL_COMMUNITY): Payer: Self-pay | Admitting: Emergency Medicine

## 2015-09-28 ENCOUNTER — Emergency Department (HOSPITAL_COMMUNITY)
Admission: EM | Admit: 2015-09-28 | Discharge: 2015-09-28 | Disposition: A | Discharge status: Home / Self Care | Payer: Managed Care, Other (non HMO) | Attending: Emergency Medicine | Admitting: Emergency Medicine

## 2015-09-28 DIAGNOSIS — R079 Chest pain, unspecified: Secondary | ICD-10-CM | POA: Insufficient documentation

## 2015-09-28 DIAGNOSIS — R55 Syncope and collapse: Secondary | ICD-10-CM | POA: Diagnosis present

## 2015-09-28 DIAGNOSIS — Z79899 Other long term (current) drug therapy: Secondary | ICD-10-CM | POA: Diagnosis not present

## 2015-09-28 DIAGNOSIS — R42 Dizziness and giddiness: Secondary | ICD-10-CM | POA: Diagnosis not present

## 2015-09-28 DIAGNOSIS — Z3202 Encounter for pregnancy test, result negative: Secondary | ICD-10-CM | POA: Diagnosis not present

## 2015-09-28 DIAGNOSIS — F329 Major depressive disorder, single episode, unspecified: Secondary | ICD-10-CM | POA: Diagnosis not present

## 2015-09-28 DIAGNOSIS — R0602 Shortness of breath: Secondary | ICD-10-CM | POA: Insufficient documentation

## 2015-09-28 HISTORY — DX: Major depressive disorder, single episode, unspecified: F32.9

## 2015-09-28 HISTORY — DX: Depression, unspecified: F32.A

## 2015-09-28 HISTORY — DX: Attention-deficit hyperactivity disorder, unspecified type: F90.9

## 2015-09-28 LAB — CBC
HCT: 34.3 % — ABNORMAL LOW (ref 36.0–46.0)
HEMOGLOBIN: 11.6 g/dL — AB (ref 12.0–15.0)
MCH: 27.9 pg (ref 26.0–34.0)
MCHC: 33.8 g/dL (ref 30.0–36.0)
MCV: 82.5 fL (ref 78.0–100.0)
Platelets: 242 10*3/uL (ref 150–400)
RBC: 4.16 MIL/uL (ref 3.87–5.11)
RDW: 12.7 % (ref 11.5–15.5)
WBC: 4.4 10*3/uL (ref 4.0–10.5)

## 2015-09-28 LAB — BASIC METABOLIC PANEL
ANION GAP: 6 (ref 5–15)
BUN: <5 mg/dL — ABNORMAL LOW (ref 6–20)
CALCIUM: 9 mg/dL (ref 8.9–10.3)
CO2: 29 mmol/L (ref 22–32)
Chloride: 105 mmol/L (ref 101–111)
Creatinine, Ser: 0.92 mg/dL (ref 0.44–1.00)
Glucose, Bld: 89 mg/dL (ref 65–99)
Potassium: 3.7 mmol/L (ref 3.5–5.1)
SODIUM: 140 mmol/L (ref 135–145)

## 2015-09-28 LAB — CBG MONITORING, ED: GLUCOSE-CAPILLARY: 73 mg/dL (ref 65–99)

## 2015-09-28 LAB — URINALYSIS, ROUTINE W REFLEX MICROSCOPIC
Bilirubin Urine: NEGATIVE
Glucose, UA: NEGATIVE mg/dL
HGB URINE DIPSTICK: NEGATIVE
Ketones, ur: NEGATIVE mg/dL
Nitrite: NEGATIVE
PH: 6 (ref 5.0–8.0)
Protein, ur: NEGATIVE mg/dL
Specific Gravity, Urine: 1.018 (ref 1.005–1.030)

## 2015-09-28 LAB — URINE MICROSCOPIC-ADD ON

## 2015-09-28 LAB — I-STAT BETA HCG BLOOD, ED (MC, WL, AP ONLY)

## 2015-09-28 LAB — I-STAT TROPONIN, ED: TROPONIN I, POC: 0 ng/mL (ref 0.00–0.08)

## 2015-09-28 MED ORDER — SODIUM CHLORIDE 0.9 % IV BOLUS (SEPSIS)
1000.0000 mL | Freq: Once | INTRAVENOUS | Status: AC
  Administered 2015-09-28: 1000 mL via INTRAVENOUS

## 2015-09-28 NOTE — ED Provider Notes (Signed)
CSN: [id number]     Arrival date & time 09/28/15  [id number] History   First MD Initiated Contact with Patient 09/28/15 1002     Chief Complaint  Patient presents with  . Loss of Consciousness     (Consider location/radiation/quality/duration/timing/severity/associated sxs/prior Treatment) HPI Nancy Olson Self is a 24 y.o. female who presents to emergency department after syncopal episode. Patient states she was at work, states she got up from sitting position and started walking, went around a corner and states she became very dizzy, and passed out. Patient states she hit her head on the floor but denies a headache at this time. Patient reports some chest pressure, which she states is not new. Patient has history of chest pain and palpitations for which she takes metoprolol. She states she only takes as needed and took a tablet this morning because she was feeling palpitations. Last time she took metoprolol was 3 days ago. She denies any new medications. She denies any drugs or alcohol. She states that she feels very tired right now otherwise has no other complaints. She states she is not sure why she is so tired. She states that she got full night of sleep. Denies recent travel or surgeries. No swelling in extremities. No recent illnesses. Not a smoker.   Past Medical History  Diagnosis Date  . Atrial flutter (HCC)   . Depression   . ADHD (attention deficit hyperactivity disorder)    Past Surgical History  Procedure Laterality Date  . Knee surgery Right    Family History  Problem Relation Age of Onset  . Sickle cell anemia Mother   . Sickle cell anemia Sister    Social History  Substance Use Topics  . Smoking status: Never Smoker   . Smokeless tobacco: None  . Alcohol Use: No   OB History    No data available     Review of Systems  Constitutional: Negative for fever and chills.  Respiratory: Positive for chest tightness and shortness of breath. Negative for cough.    Cardiovascular: Negative for chest pain, palpitations and leg swelling.  Gastrointestinal: Negative for nausea, vomiting, abdominal pain and diarrhea.  Genitourinary: Negative for dysuria and flank pain.  Musculoskeletal: Negative for myalgias, arthralgias, neck pain and neck stiffness.  Skin: Negative for rash.  Neurological: Positive for dizziness, syncope and light-headedness. Negative for weakness and headaches.  All other systems reviewed and are negative.     Allergies  Review of patient's allergies indicates no known allergies.  Home Medications   Prior to Admission medications   Medication Sig Start Date End Date Taking? Authorizing Provider  buPROPion (WELLBUTRIN XL) 150 MG 24 hr tablet Take 1 tablet (150 mg total) by mouth daily. 09/09/15  Yes Jade L Breeback, PA-C  citalopram (CELEXA) 20 MG tablet Take 1 tablet (20 mg total) by mouth daily. 09/09/15  Yes Jade L Breeback, PA-C  metoprolol succinate (TOPROL XL) 25 MG 24 hr tablet Take 1 tablet (25 mg total) by mouth daily. 09/09/15  Yes Jade L Breeback, PA-C  ALPRAZolam (XANAX) 0.5 MG tablet Take 1 tablet (0.5 mg total) by mouth at bedtime as needed for anxiety. Patient not taking: Reported on 09/28/2015 09/09/15   Jade L Breeback, PA-C   BP 115/77 mmHg  Pulse 70  Temp(Src) 98.5 F (36.9 C)  Resp 16  Ht 5\' 2"  (1.575 m)  Wt 60.328 kg  BMI 24.32 kg/m2  SpO2 99%  LMP 09/14/2015 Physical Exam  Constitutional: She is oriented to person, place,  and time. She appears well-developed and well-nourished.  Appears somnolent, easily arousable to voice.   HENT:  Head: Normocephalic.  Eyes: Conjunctivae are normal.  Neck: Neck supple.  Cardiovascular: Normal rate, regular rhythm and normal heart sounds.   Pulmonary/Chest: Effort normal and breath sounds normal. No respiratory distress. She has no wheezes. She has no rales.  Abdominal: Soft. Bowel sounds are normal. She exhibits no distension. There is no tenderness. There is no  rebound.  Musculoskeletal: She exhibits no edema.  Neurological: She is alert and oriented to person, place, and time. No cranial nerve deficit. Coordination normal.  Skin: Skin is warm and dry.  Psychiatric: She has a normal mood and affect. Her behavior is normal.  Nursing note and vitals reviewed.   ED Course  Procedures (including critical care time) Labs Review Labs Reviewed  BASIC METABOLIC PANEL - Abnormal; Notable for the following:    BUN <5 (*)    All other components within normal limits  CBC - Abnormal; Notable for the following:    Hemoglobin 11.6 (*)    HCT 34.3 (*)    All other components within normal limits  URINALYSIS, ROUTINE W REFLEX MICROSCOPIC (NOT AT [facility]) - Abnormal; Notable for the following:    APPearance TURBID (*)    Leukocytes, UA TRACE (*)    All other components within normal limits  URINE MICROSCOPIC-ADD ON - Abnormal; Notable for the following:    Squamous Epithelial / LPF 6-30 (*)    Bacteria, UA FEW (*)    All other components within normal limits  CBG MONITORING, ED  I-STAT TROPOININ, ED  I-STAT BETA HCG BLOOD, ED ([facility], [facility], [facility] ONLY)    Imaging Review No results found. I have personally reviewed and evaluated these images and lab results as part of my medical decision-making.   EKG Interpretation None      MDM   Final diagnoses:  Syncope, unspecified syncope type    Patient emergency department after syncopal episode at work. She denies any complaints other than feeling tired. Her exam is unremarkable. Patient feels dizzy when she is ambulating. Orthostatics obtained and it looks like patient is orthostatic with heart rate jumping from 68- to 107 upon standing. Will hydrate. Labs including troponin and hCG negative. Electrolytes unremarkable.  2:04 PM Patient hydrated. She ate lunch. She feels much better. She underwent up-and-down the hall with no difficulty. Patient denies taking too much of her medications or tried to harm  herself. She denies drugs or alcohol. Upon discharge home, vital signs are normal, patient is in no distress. Stable for outpatient follow-up as needed. Return precautions discussed.  Filed Vitals:   09/28/15 1121 09/28/15 1123 09/28/15 1145 09/28/15 1200  BP: 104/63 99/64 108/68 102/61  Pulse: 78 107 63 65  Temp:      Resp: Height:      Weight:      SpO2: 100% 100% 100% 100%     Jaynie Crumble, PA-C 09/28/15 1404  Melene Plan, DO 09/28/15 1650

## 2015-09-28 NOTE — Discharge Instructions (Signed)
The sugar to drink plenty of fluids. Get plenty of sleep. Eat well-balanced meals. Follow-up with primary care doctor. Return if worsening symptoms.   Syncope Syncope is a medical term for fainting or passing out. This means you lose consciousness and drop to the ground. People are generally unconscious for less than 5 minutes. You may have some muscle twitches for up to 15 seconds before waking up and returning to normal. Syncope occurs more often in older adults, but it can happen to anyone. While most causes of syncope are not dangerous, syncope can be a sign of a serious medical problem. It is important to seek medical care.  CAUSES  Syncope is caused by a sudden drop in blood flow to the brain. The specific cause is often not determined. Factors that can bring on syncope include:  Taking medicines that lower blood pressure.  Sudden changes in posture, such as standing up quickly.  Taking more medicine than prescribed.  Standing in one place for too long.  Seizure disorders.  Dehydration and excessive exposure to heat.  Low blood sugar (hypoglycemia).  Straining to have a bowel movement.  Heart disease, irregular heartbeat, or other circulatory problems.  Fear, emotional distress, seeing blood, or severe pain. SYMPTOMS  Right before fainting, you may:  Feel dizzy or light-headed.  Feel nauseous.  See all white or all black in your field of vision.  Have cold, clammy skin. DIAGNOSIS  Your health care provider will ask about your symptoms, perform a physical exam, and perform an electrocardiogram (ECG) to record the electrical activity of your heart. Your health care provider may also perform other heart or blood tests to determine the cause of your syncope which may include:  Transthoracic echocardiogram (TTE). During echocardiography, sound waves are used to evaluate how blood flows through your heart.  Transesophageal echocardiogram (TEE).  Cardiac monitoring. This  allows your health care provider to monitor your heart rate and rhythm in real time.  Holter monitor. This is a portable device that records your heartbeat and can help diagnose heart arrhythmias. It allows your health care provider to track your heart activity for several days, if needed.  Stress tests by exercise or by giving medicine that makes the heart beat faster. TREATMENT  In most cases, no treatment is needed. Depending on the cause of your syncope, your health care provider may recommend changing or stopping some of your medicines. HOME CARE INSTRUCTIONS  Have someone stay with you until you feel stable.  Do not drive, use machinery, or play sports until your health care provider says it is okay.  Keep all follow-up appointments as directed by your health care provider.  Lie down right away if you start feeling like you might faint. Breathe deeply and steadily. Wait until all the symptoms have passed.  Drink enough fluids to keep your urine clear or pale yellow.  If you are taking blood pressure or heart medicine, get up slowly and take several minutes to sit and then stand. This can reduce dizziness. SEEK IMMEDIATE MEDICAL CARE IF:   You have a severe headache.  You have unusual pain in the chest, abdomen, or back.  You are bleeding from your mouth or rectum, or you have black or tarry stool.  You have an irregular or very fast heartbeat.  You have pain with breathing.  You have repeated fainting or seizure-like jerking during an episode.  You faint when sitting or lying down.  You have confusion.  You have trouble  walking.  You have severe weakness.  You have vision problems. If you fainted, call your local emergency services (911 in [place] Do not drive yourself to the hospital.    This information is not intended to replace advice given to you by your health care provider. Make sure you discuss any questions you have with your health care provider.     Document Released: 10/22/2005 Document Revised: 03/08/2015 Document Reviewed: 12/21/2011 Elsevier Interactive Patient Education [employer]

## 2015-09-28 NOTE — ED Notes (Signed)
Onset today while at work syncope EMS reported sitting on carpet floor. States hit head and body hit floor. Denies head pain sleepy alert following commands appropriate.

## 2015-10-06 ENCOUNTER — Ambulatory Visit (INDEPENDENT_AMBULATORY_CARE_PROVIDER_SITE_OTHER): Payer: Managed Care, Other (non HMO) | Admitting: Family Medicine

## 2015-10-06 ENCOUNTER — Encounter: Payer: Self-pay | Admitting: Family Medicine

## 2015-10-06 VITALS — BP 125/71 | HR 85 | Wt 136.0 lb

## 2015-10-06 DIAGNOSIS — I493 Ventricular premature depolarization: Secondary | ICD-10-CM

## 2015-10-06 DIAGNOSIS — F411 Generalized anxiety disorder: Secondary | ICD-10-CM | POA: Diagnosis not present

## 2015-10-06 DIAGNOSIS — R0789 Other chest pain: Secondary | ICD-10-CM

## 2015-10-06 DIAGNOSIS — R002 Palpitations: Secondary | ICD-10-CM | POA: Diagnosis not present

## 2015-10-06 NOTE — Assessment & Plan Note (Signed)
See palpitation section. Recommend patient avoid exertion and use metoprolol daily. Discussed precautions.

## 2015-10-06 NOTE — Assessment & Plan Note (Signed)
Chronic, poorly controlled. Patient counseled on taking Wellbutrin and Celexa daily, with use of Xanax for breakthrough anxiety or discrete panic attacks.

## 2015-10-06 NOTE — Assessment & Plan Note (Addendum)
Subacute, persistent. Concerning given stoppage of stimulant medication. Warrants evaluation by cardiology. Will order echo and Holter monitor given concern for structural pathology with recent syncope and known persistent arrhythmia. 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 Recommend daily metoprolol until cardiology can evaluate.  Cardiology appointment scheduled January 11

## 2015-10-06 NOTE — Patient Instructions (Addendum)
Thank you for coming in today. You were seen today for your chest pain and palpitations. We are very concerned about your recent symptoms and recommend seeing a cardiologist sooner than your January appointment.  We will be ordering an echocardiogram to examine your heart structure as well as a Holter monitor to examine your heart electricity.  Please take Celexa and Wellbutrin for anxiety daily, and take Xanax as needed for episodes of worsening anxiety.  Please follow up with Jade in 2 weeks.  For worsening palpitations and/or chest pain, please go to the nearest emergency room.   We will schedule the cariology tests. Please expect phone calls. You have an appointment with a cardiologist here in this building downstairs at 4 PM on January 11.  Do not exert Yourself.  Take the Toprol every day Follow-up with your primary care provider soon

## 2015-10-06 NOTE — Progress Notes (Signed)
Nancy Olson is a 24 y.o. female who [facility] today for chest pain and palpitations.  1) Chest pain/palpitations: This issue first started when she started taking Adderrall in September for ADHD when she started having daily palpitations and chest pain. Patient has had numerous EKGs since starting the medication, found with recurrent unifocal PVCs. She stopped the Adderrall in October due to this. She continues to have every other day palpitations and chest pain, worsened with anxiety, especially recently. Of note, she was seen in the ER 2 weeks ago for a syncopal event thought to be secondary to orthostatic hypotension. Since then, her symptoms have persisted. Today, she began having chest pain and palpitations after being woken up from a nap and frightened. They occur every 15 minutes but were not actively present during the visit. Patient states this is consistent with past episodes. They are directly correlated with exertion and anxiety. Recently, though instructed to not exert herself, she went for a jog and reliably reproduced the palpitations. Patient notes the pain feels like soreness after many episodes of tightness from anxiety and says she is able to reproduce the pain with palpation. Patient denies diaphoresis and radiation of pain. Patient main concern was getting a note to stay out of physical activity through the [employer].   Patient vocalized concern about the cost of EKG and refused an EKG in the office today. Patient has an appointment with cardiologist in January to be evaluated further.   2) Anxiety: patient notes she has had increased anxiety lately related to her health. She states she takes Wellbutrin and Celexa daily, though she often forgets to take Celexa at night and wants to stop taking Wellbutrin because "it is not a stimulant".   3) ADHD: Patient wants to go back on medication as she feels this has worsened, though not  interfering with worl.      Past Medical History  Diagnosis Date  . Atrial flutter (HCC)   . Depression   . ADHD (attention deficit hyperactivity disorder)    Past Surgical History  Procedure Laterality Date  . Knee surgery Right    Social History  Substance Use Topics  . Smoking status: Never Smoker   . Smokeless tobacco: Not on file  . Alcohol Use: No   family history includes Sickle cell anemia in her mother and sister.  ROS as above Medications: Current Outpatient Prescriptions  Medication Sig Dispense Refill  . ALPRAZolam (XANAX) 0.5 MG tablet Take 1 tablet (0.5 mg total) by mouth at bedtime as needed for anxiety. 30 tablet 0  . buPROPion (WELLBUTRIN XL) 150 MG 24 hr tablet Take 1 tablet (150 mg total) by mouth daily. 30 tablet 2  . citalopram (CELEXA) 20 MG tablet Take 1 tablet (20 mg total) by mouth daily. 30 tablet 2  . metoprolol succinate (TOPROL XL) 25 MG 24 hr tablet Take 1 tablet (25 mg total) by mouth daily. 30 tablet 1   No current facility-administered medications for this visit.   No Known Allergies   Exam:  BP 125/71 mmHg  Pulse 85  Wt 136 lb (61.689 kg)  LMP 09/14/2015 Gen: Well-appearing, non toxic lady in NAD seated in a chair  HEENT: EOMI,  MMM Lungs: Normal work of breathing. CTABL no wheezes or crackles  Heart: RRR no MRG. No palpitations appreciated. Distal pulses 2+ x4 extremities.  Chest: tender in 2nd intercostal space with pressing of stehtoscope  Abd: NABS, Soft. Nondistended, Nontender Exts: Brisk  capillary refill, warm and well perfused Skin: dry   No results found for this or any previous visit (from the past 24 hour(s)). No results found.   Please see individual assessment and plan sections.

## 2015-10-11 ENCOUNTER — Encounter: Payer: Self-pay | Admitting: Physician Assistant

## 2015-10-11 ENCOUNTER — Ambulatory Visit (INDEPENDENT_AMBULATORY_CARE_PROVIDER_SITE_OTHER): Payer: Managed Care, Other (non HMO) | Admitting: Physician Assistant

## 2015-10-11 VITALS — BP 119/60 | HR 60 | Ht 62.0 in | Wt 138.0 lb

## 2015-10-11 DIAGNOSIS — R55 Syncope and collapse: Secondary | ICD-10-CM | POA: Diagnosis not present

## 2015-10-11 DIAGNOSIS — I493 Ventricular premature depolarization: Secondary | ICD-10-CM

## 2015-10-11 DIAGNOSIS — R0789 Other chest pain: Secondary | ICD-10-CM

## 2015-10-11 LAB — CBC AND DIFFERENTIAL
HEMATOCRIT: 35 % — AB (ref 36–46)
HEMOGLOBIN: 11.7 g/dL — AB (ref 12.0–16.0)
PLATELETS: 290 10*3/uL (ref 150–399)
WBC: 5.1 10^3/mL

## 2015-10-11 LAB — ANTINUCLEAR ANTIBODIES, IFA: Antinuclear antibodies: NEGATIVE

## 2015-10-11 LAB — SEDIMENTATION RATE: Sedimentation Rate-Westergren: 2

## 2015-10-11 LAB — VITAMIN D 25 HYDROXY (VIT D DEFICIENCY, FRACTURES): Vit D, 25-Hydroxy: 10.4

## 2015-10-11 LAB — VITAMIN B12: VITAMIN B12: 988

## 2015-10-11 NOTE — Patient Instructions (Signed)
Premature Ventricular Contraction A premature ventricular contraction is an irregularity in the normal heart rhythm. These contractions are extra heartbeats that occur too early in the normal sequence. In most cases, these contractions are harmless and do not require treatment. CAUSES Premature ventricular contractions may occur without a known cause. In healthy people, the extra contractions may be caused by:  Smoking.  Drinking alcohol.  Caffeine.  Certain medicines.  Some illegal drugs.  Stress. Sometimes, changes in chemicals in the blood (electrolytes) can also cause premature ventricular contractions. They can also occur in people with heart diseases that cause a decrease in blood flow to the heart. SIGNS AND SYMPTOMS Premature ventricular contractions often do not cause any symptoms. In some cases, you may have a feeling of your heart beating fast or skipping a beat (palpitations). DIAGNOSIS Your health care provider will take your medical history and do a physical exam. During the exam, the health care provider will check for irregular heartbeats. Various tests may be done to help diagnose premature ventricular contractions. These tests may include:  An ECG (electrocardiogram) to monitor the electrical activity of your heart.  Holter monitor testing. A Holter monitor is a portable device that can monitor the electrical activity of your heart over longer periods of time.  Stress tests to see how exercise affects your heart rhythm.  Echocardiogram. This test uses sound waves (ultrasound) to produce an image of your heart.  Electrophysiology study. This is used to evaluate the electrical conduction system of your heart. TREATMENT Usually, no treatment is needed. You may be advised to avoid things that can trigger the premature contractions, such as caffeine or alcohol. Medicines are sometimes given if symptoms are severe or if the extra heartbeats are very frequent. Treatment may  also be needed for an underlying cause of the contractions if one is found. HOME CARE INSTRUCTIONS  Take medicines only as directed by your health care provider.  Make any lifestyle changes recommended by your health care provider. These may include:  Quitting smoking.  Avoiding or limiting caffeine or alcohol.  Exercising. Talk to your health care provider about what type of exercise is safe for you.  Trying to reduce stress.  Keep all follow-up visits with your health care provider. This is important. SEEK IMMEDIATE MEDICAL CARE IF:  You feel palpitations that are frequent or continual.  You have chest pain.  You have shortness of breath.  You have sweating for no reason.  You have nausea and vomiting.  You become light-headed or faint.   This information is not intended to replace advice given to you by your health care provider. Make sure you discuss any questions you have with your health care provider.   Document Released: 06/08/2004 Document Revised: 11/12/2014 Document Reviewed: 03/25/2014 Elsevier Interactive Patient Education 2016 Else[employer] Inc.  

## 2015-10-12 ENCOUNTER — Telehealth: Payer: Self-pay | Admitting: Physician Assistant

## 2015-10-12 DIAGNOSIS — E559 Vitamin D deficiency, unspecified: Secondary | ICD-10-CM

## 2015-10-12 LAB — VITAMIN D 25 HYDROXY (VIT D DEFICIENCY, FRACTURES): VIT D 25 HYDROXY: 10.4

## 2015-10-12 LAB — VITAMIN B12: VITAMIN B12: 988

## 2015-10-12 MED ORDER — VITAMIN D (ERGOCALCIFEROL) 1.25 MG (50000 UNIT) PO CAPS
50000.0000 [IU] | ORAL_CAPSULE | ORAL | Status: DC
Start: 2015-10-12 — End: 2016-06-29

## 2015-10-12 NOTE — Telephone Encounter (Signed)
Call pt: I got labs through [facility]. WBC good, hgb good, b12 a little high-stop taking any b12 supplements or at least cut in half, inflammation rate normal.  ANA still pending.   Vitamin D very low. Will send high dose once weekly for 3 months and then recheck.

## 2015-10-12 NOTE — Progress Notes (Signed)
   Subjective:    Patient ID: Nancy Olson, female    DOB: May 29, 1991, 24 y.o.   MRN: [id number]  HPI Pt presents to the clinic to fill out paperwork for [employer] and work. She has been seen many times by ER and PCP for atypical chest pains, PVC's and syncope. She has cardiologist appt for January. She is concerned because currently she is having to still report for [employer] duty. She is concerned something might happen. She is very anxious and concerned. She does report she has cried less. She seems overwhelmed.    Review of Systems  All other systems reviewed and are negative.      Objective:   Physical Exam  Constitutional: She is oriented to person, place, and time. She appears well-developed and well-nourished.  HENT:  Head: Normocephalic and atraumatic.  Cardiovascular: Normal rate and normal heart sounds.   Auscultated a few PVC's while listening to heart.   Pulmonary/Chest: Effort normal and breath sounds normal. She has no wheezes.  Neurological: She is alert and oriented to person, place, and time.  Psychiatric: She has a normal mood and affect. Her behavior is normal.          Assessment & Plan:  Atypical chest pains/PVC's/syncope- called cardiology while in office and moved appt to December 14th. Reassured pt that EKG was normal. I was not allowed to order holter or stress test due to insurance requiring a cardiologist order. I did go ahead and write out of work intermittently for next 6 months and military as well until evaluated by cardiologist. Syncope has only been once and was after she stood suddenly. Likely orthostatic. Treating for anxiety as possible cause. On celexa and wellbutrin crying less but pt appears to be anxious and stressed. Once full cardiac work up is done we can focus on anxiety.   See scanned documents.

## 2015-10-14 NOTE — Telephone Encounter (Signed)
Left message informing patient to call office to let her know about lab results and recommendations.

## 2015-10-17 NOTE — Telephone Encounter (Signed)
Call pt: ANA negative. This is the auto-immune lab.

## 2015-10-18 NOTE — Telephone Encounter (Signed)
Notified of ANA lab result.

## 2015-10-19 ENCOUNTER — Ambulatory Visit (INDEPENDENT_AMBULATORY_CARE_PROVIDER_SITE_OTHER): Payer: Managed Care, Other (non HMO) | Admitting: Cardiology

## 2015-10-19 ENCOUNTER — Encounter: Payer: Self-pay | Admitting: Cardiology

## 2015-10-19 VITALS — BP 115/70 | HR 67 | Ht 62.0 in | Wt 135.4 lb

## 2015-10-19 DIAGNOSIS — R002 Palpitations: Secondary | ICD-10-CM

## 2015-10-19 DIAGNOSIS — R55 Syncope and collapse: Secondary | ICD-10-CM

## 2015-10-19 DIAGNOSIS — R0789 Other chest pain: Secondary | ICD-10-CM

## 2015-10-19 NOTE — Assessment & Plan Note (Signed)
Episode sounds to have been potentially orthostatic mediated.Plan echocardiogram to assess LV function.

## 2015-10-19 NOTE — Assessment & Plan Note (Signed)
Symptoms extremely atypical. Not consistent with cardiac pain. Possible musculoskeletal component.

## 2015-10-19 NOTE — Progress Notes (Signed)
     HPI: 24 yo female for evaluation of CP. Seen in ER 10/16 with CP and palpitations. Had syncopal episode 11/16 and seen in ER; felt to be related to orthostasis. Labs 11/16 showed Hgb 11.6, normal troponin, normal chest xray. Patient states she began having limitations in August. These are described as a skipping and racing. She also describes dyspnea both at rest and with exertion. She also has had chest pain. The pain is in the substernal area and described as sharp. It is not exertional. It increases with drinking coffee and deep inspiration. She also states in October she had an episode where she was incoherent for 45 minutes. In November she had an episode of syncope after standing and walking to the bathroom. No preceding warning. Unconscious for several minutes.   Current Outpatient Prescriptions  Medication Sig Dispense Refill  . ALPRAZolam (XANAX) 0.5 MG tablet Take 1 tablet (0.5 mg total) by mouth at bedtime as needed for anxiety. 30 tablet 0  . buPROPion (WELLBUTRIN XL) 150 MG 24 hr tablet Take 1 tablet (150 mg total) by mouth daily. 30 tablet 2  . citalopram (CELEXA) 20 MG tablet Take 1 tablet (20 mg total) by mouth daily. 30 tablet 2  . metoprolol succinate (TOPROL XL) 25 MG 24 hr tablet Take 1 tablet (25 mg total) by mouth daily. 30 tablet 1  . Vitamin D, Ergocalciferol, (DRISDOL) 50000 UNITS CAPS capsule Take 1 capsule (50,000 Units total) by mouth every 7 (seven) days. 12 capsule 0   No current facility-administered medications for this visit.    No Known Allergies   Past Medical History  Diagnosis Date  . Depression   . ADHD (attention deficit hyperactivity disorder)     Past Surgical History  Procedure Laterality Date  . Knee surgery Right     Social History   Social History  . Marital Status: Single    Spouse Name: N/A  . Number of Children: N/A  . Years of Education: N/A   Occupational History  .      [employer]   Social History Main Topics  . Smoking  status: Never Smoker   . Smokeless tobacco: Not on file  . Alcohol Use: No  . Drug Use: No  . Sexual Activity: Not Currently   Other Topics Concern  . Not on file   Social History Narrative    Family History  Problem Relation Age of Onset  . Sickle cell anemia Mother   . Sickle cell anemia Sister     ROS: no fevers or chills, productive cough, hemoptysis, dysphasia, odynophagia, melena, hematochezia, dysuria, hematuria, rash, seizure activity, orthopnea, PND, pedal edema, claudication. Remaining systems are negative.  Physical Exam:   Blood pressure 115/70, pulse 67, height 5\' 2"  (1.575 m), weight 135 lb 6.4 oz (61.417 kg), last menstrual period 09/14/2015.  General:  Well developed/well nourished in NAD Skin warm/dry Patient not depressed, anxious No peripheral clubbing Back-normal HEENT-normal/normal eyelids Neck supple/normal carotid upstroke bilaterally; no bruits; no JVD; no thyromegaly chest - CTA/ normal expansion CV - RRR/normal S1 and S2; no murmurs, rubs or gallops;  PMI nondisplaced Abdomen -NT/ND, no HSM, no mass, + bowel sounds, no bruit 2+ femoral pulses, no bruits Ext-no edema, chords, 2+ DP Neuro-grossly nonfocal  ECG 09/28/15-Sinus rhythm, nonspecific ST changes

## 2015-10-19 NOTE — Assessment & Plan Note (Signed)
Plan to continue beta blocker.Check TSH. Check echocardiogram for LV function. There appears to be a significant anxiety component.

## 2015-10-19 NOTE — Patient Instructions (Signed)
Medication Instructions:  Please continue your current medications  Labwork: Your physician recommends that you return for lab work TODAY.  Testing/Procedures: 1. Echocardiogram - Your physician has requested that you have an echocardiogram. Echocardiography is a painless test that uses sound waves to create images of your heart. It provides your doctor with information about the size and shape of your heart and how well your heart's chambers and valves are working. This procedure takes approximately one hour. There are no restrictions for this procedure.  2. Event Monitor - Your physician has recommended that you wear an event monitor. Event monitors are medical devices that record the heart's electrical activity. Doctors most often us these monitors to diagnose arrhythmias. Arrhythmias are problems with the speed or rhythm of the heartbeat. The monitor is a small, portable device. You can wear one while you do your normal daily activities. This is usually used to diagnose what is causing palpitations/syncope (passing out).  Follow-Up: Dr Jens Somrenshaw recommends that you schedule a follow-up appointment in 6-8 weeks.  If you need a refill on your cardiac medications before your next appointment, please call your pharmacy.

## 2015-10-21 ENCOUNTER — Ambulatory Visit (INDEPENDENT_AMBULATORY_CARE_PROVIDER_SITE_OTHER): Payer: Managed Care, Other (non HMO)

## 2015-10-21 DIAGNOSIS — R002 Palpitations: Secondary | ICD-10-CM

## 2015-10-21 DIAGNOSIS — R0789 Other chest pain: Secondary | ICD-10-CM | POA: Diagnosis not present

## 2015-10-27 ENCOUNTER — Encounter: Payer: Self-pay | Admitting: Physician Assistant

## 2015-10-27 ENCOUNTER — Ambulatory Visit (INDEPENDENT_AMBULATORY_CARE_PROVIDER_SITE_OTHER): Payer: Managed Care, Other (non HMO) | Admitting: Family Medicine

## 2015-10-27 ENCOUNTER — Encounter: Payer: Self-pay | Admitting: Family Medicine

## 2015-10-27 VITALS — BP 128/70 | HR 90 | Wt 135.0 lb

## 2015-10-27 DIAGNOSIS — L259 Unspecified contact dermatitis, unspecified cause: Secondary | ICD-10-CM | POA: Diagnosis not present

## 2015-10-27 DIAGNOSIS — R002 Palpitations: Secondary | ICD-10-CM | POA: Diagnosis not present

## 2015-10-27 MED ORDER — TRIAMCINOLONE ACETONIDE 0.5 % EX OINT: 1.0000 "application " | TOPICAL_OINTMENT | Freq: Two times a day (BID) | CUTANEOUS | Status: DC

## 2015-10-27 NOTE — Assessment & Plan Note (Signed)
Due to adhesive. Treat with triamcinolone ointment. Return as needed with PCP.

## 2015-10-27 NOTE — Patient Instructions (Signed)
Thank you for coming in today. Follow up with Tandy GawJade Breeback PA-C to change medicines for anxiety.  Try using the ointment on the rash from the event monitor.  Return as needed.   Contact Dermatitis Dermatitis is redness, soreness, and swelling (inflammation) of the skin. Contact dermatitis is a reaction to certain substances that touch the skin. There are two types of contact dermatitis:   Irritant contact dermatitis. This type is caused by something that irritates your skin, such as dry hands from washing them too much. This type does not require previous exposure to the substance for a reaction to occur. This type is more common.  Allergic contact dermatitis. This type is caused by a substance that you are allergic to, such as a nickel allergy or poison ivy. This type only occurs if you have been exposed to the substance (allergen) before. Upon a repeat exposure, your body reacts to the substance. This type is less common. CAUSES  Many different substances can cause contact dermatitis. Irritant contact dermatitis is most commonly caused by exposure to:   Makeup.   Soaps.   Detergents.   Bleaches.   Acids.   Metal salts, such as nickel.  Allergic contact dermatitis is most commonly caused by exposure to:   Poisonous plants.   Chemicals.   Jewelry.   Latex.   Medicines.   Preservatives in products, such as clothing.  RISK FACTORS This condition is more likely to develop in:   People who have jobs that expose them to irritants or allergens.  People who have certain medical conditions, such as asthma or eczema.  SYMPTOMS  Symptoms of this condition may occur anywhere on your body where the irritant has touched you or is touched by you. Symptoms include:  Dryness or flaking.   Redness.   Cracks.   Itching.   Pain or a burning feeling.   Blisters.  Drainage of small amounts of blood or clear fluid from skin cracks. With allergic contact  dermatitis, there may also be swelling in areas such as the eyelids, mouth, or genitals.  DIAGNOSIS  This condition is diagnosed with a medical history and physical exam. A patch skin test may be performed to help determine the cause. If the condition is related to your job, you may need to see an occupational medicine specialist. TREATMENT Treatment for this condition includes figuring out what caused the reaction and protecting your skin from further contact. Treatment may also include:   Steroid creams or ointments. Oral steroid medicines may be needed in more severe cases.  Antibiotics or antibacterial ointments, if a skin infection is present.  Antihistamine lotion or an antihistamine taken by mouth to ease itching.  A bandage (dressing). HOME CARE INSTRUCTIONS Skin Care  Moisturize your skin as needed.   Apply cool compresses to the affected areas.  Try taking a bath with:  Epsom salts. Follow the instructions on the packaging. You can get these at your local pharmacy or grocery store.  Baking soda. Pour a small amount into the bath as directed by your health care provider.  Colloidal oatmeal. Follow the instructions on the packaging. You can get this at your local pharmacy or grocery store.  Try applying baking soda paste to your skin. Stir water into baking soda until it reaches a paste-like consistency.  Do not scratch your skin.  Bathe less frequently, such as every other day.  Bathe in lukewarm water. Avoid using hot water. Medicines  Take or apply over-the-counter and prescription  medicines only as told by your health care provider.   If you were prescribed an antibiotic medicine, take or apply your antibiotic as told by your health care provider. Do not stop using the antibiotic even if your condition starts to improve. General Instructions  Keep all follow-up visits as told by your health care provider. This is important.  Avoid the substance that caused  your reaction. If you do not know what caused it, keep a journal to try to track what caused it. Write down:  What you eat.  What cosmetic products you use.  What you drink.  What you wear in the affected area. This includes jewelry.  If you were given a dressing, take care of it as told by your health care provider. This includes when to change and remove it. SEEK MEDICAL CARE IF:   Your condition does not improve with treatment.  Your condition gets worse.  You have signs of infection such as swelling, tenderness, redness, soreness, or warmth in the affected area.  You have a fever.  You have new symptoms. SEEK IMMEDIATE MEDICAL CARE IF:   You have a severe headache, neck pain, or neck stiffness.  You vomit.  You feel very sleepy.  You notice red streaks coming from the affected area.  Your bone or joint underneath the affected area becomes painful after the skin has healed.  The affected area turns darker.  You have difficulty breathing.   This information is not intended to replace advice given to you by your health care provider. Make sure you discuss any questions you have with your health care provider.   Document Released: 10/19/2000 Document Revised: 07/13/2015 Document Reviewed: 03/09/2015 Elsevier Interactive Patient Education [employer].

## 2015-10-27 NOTE — Progress Notes (Signed)
       Nancy Olson is a 24 y.o. female [facility] for FMLA paperwork and rash.  1) patient continues to experience palpitations and chest pain and pressure. She's had an evaluation with cardiology already and hasn't echocardiogram and event monitor scheduled. She had some FMLA paperwork filled out via her PCP recently and returns today to correct some items [facility]. She has had no change in her palpitations or chest pain.  2) additionally patient notes a rash. This is on her chest in the area of the event monitor. She notes the adhesive irritates her skin. She has not tried any medications or treatment yet.    Past Medical History  Diagnosis Date  . Depression   . ADHD (attention deficit hyperactivity disorder)    Past Surgical History  Procedure Laterality Date  . Knee surgery Right    Social History  Substance Use Topics  . Smoking status: Never Smoker   . Smokeless tobacco: Not on file  . Alcohol Use: No   family history includes Sickle cell anemia in her mother and sister.  ROS as above Medications: Current Outpatient Prescriptions  Medication Sig Dispense Refill  . citalopram (CELEXA) 20 MG tablet Take 1 tablet (20 mg total) by mouth daily. 30 tablet 2  . metoprolol succinate (TOPROL XL) 25 MG 24 hr tablet Take 1 tablet (25 mg total) by mouth daily. 30 tablet 1  . ALPRAZolam (XANAX) 0.5 MG tablet Take 1 tablet (0.5 mg total) by mouth at bedtime as needed for anxiety. (Patient not taking: Reported on 10/27/2015) 30 tablet 0  . buPROPion (WELLBUTRIN XL) 150 MG 24 hr tablet Take 1 tablet (150 mg total) by mouth daily. (Patient not taking: Reported on 10/27/2015) 30 tablet 2  . triamcinolone ointment (KENALOG) 0.5 % Apply 1 application topically 2 (two) times daily. To affected area, avoid eyes and face 30 g 3  . Vitamin D,  Ergocalciferol, (DRISDOL) 50000 UNITS CAPS capsule Take 1 capsule (50,000 Units total) by mouth every 7 (seven) days. (Patient not taking: Reported on 10/27/2015) 12 capsule 0   No current facility-administered medications for this visit.   No Known Allergies   Exam:  BP 128/70 mmHg  Pulse 90  Wt 135 lb (61.236 kg)  LMP 09/14/2015 Gen: Well NAD Skin: Erythematous macular scaly rash on left chest. Nontender.  No results found for this or any previous visit (from the past 24 hour(s)). No results found.   Please see individual assessment and plan sections.

## 2015-10-27 NOTE — Assessment & Plan Note (Signed)
FMLA paperwork corrected. Follow-up with PCP.

## 2015-11-02 ENCOUNTER — Encounter (HOSPITAL_COMMUNITY): Payer: Self-pay | Admitting: Radiology

## 2015-11-02 ENCOUNTER — Ambulatory Visit (HOSPITAL_COMMUNITY): Payer: Managed Care, Other (non HMO) | Attending: Cardiovascular Disease

## 2015-11-16 ENCOUNTER — Ambulatory Visit: Payer: Self-pay | Admitting: Cardiology

## 2015-11-29 ENCOUNTER — Encounter: Payer: Self-pay | Admitting: Physician Assistant

## 2015-11-30 ENCOUNTER — Encounter: Payer: Self-pay | Admitting: Physician Assistant

## 2015-12-05 ENCOUNTER — Other Ambulatory Visit: Payer: Self-pay | Admitting: Physician Assistant

## 2015-12-06 ENCOUNTER — Encounter: Payer: Self-pay | Admitting: Physician Assistant

## 2015-12-06 ENCOUNTER — Ambulatory Visit (INDEPENDENT_AMBULATORY_CARE_PROVIDER_SITE_OTHER): Payer: Managed Care, Other (non HMO) | Admitting: Physician Assistant

## 2015-12-06 VITALS — BP 113/60 | HR 78 | Ht 62.0 in | Wt 138.0 lb

## 2015-12-06 DIAGNOSIS — F902 Attention-deficit hyperactivity disorder, combined type: Secondary | ICD-10-CM

## 2015-12-06 DIAGNOSIS — R0789 Other chest pain: Secondary | ICD-10-CM | POA: Diagnosis not present

## 2015-12-06 DIAGNOSIS — I493 Ventricular premature depolarization: Secondary | ICD-10-CM

## 2015-12-06 DIAGNOSIS — F411 Generalized anxiety disorder: Secondary | ICD-10-CM

## 2015-12-06 MED ORDER — METOPROLOL SUCCINATE ER 25 MG PO TB24: 25.0000 mg | ORAL_TABLET | Freq: Every day | ORAL | Status: DC

## 2015-12-06 MED ORDER — AMPHETAMINE-DEXTROAMPHET ER 20 MG PO CP24: 20.0000 mg | ORAL_CAPSULE | ORAL | Status: DC

## 2015-12-06 MED ORDER — CITALOPRAM HYDROBROMIDE 20 MG PO TABS: 20.0000 mg | ORAL_TABLET | Freq: Every day | ORAL | Status: DC

## 2015-12-06 MED ORDER — ATOMOXETINE HCL 40 MG PO CAPS: 40.0000 mg | ORAL_CAPSULE | Freq: Every day | ORAL | Status: DC

## 2015-12-06 NOTE — Progress Notes (Signed)
   Subjective:    Patient ID: Nancy Olson, female    DOB: 08-30-1991, 24 y.o.   MRN: [id number]  HPI Pt is a 25 yo female who presents to the clinic to discuss ADHD. She was taken off her Adderall in October due to onset of palpitations and atypical chest pain that started soon after starting adderall. She is still symptomatic despite being on metoprolol  daily. She has seen cardiologist and no cardiac cause can be found. Pt admits to being anxious and stress. Unfortunately she thinks a lot of these symptoms come from her not being able to focus. She wants her adderall back. She has tried wellbutrin and she stated "it does nothing".   Pt also wants to know why she is "jumpy" all the time. She relates it to when you almost fall asleep but then jump. She reports this happening multiple times a day.    Review of Systems  All other systems reviewed and are negative.      Objective:   Physical Exam  Constitutional: She is oriented to person, place, and time. She appears well-developed and well-nourished.  HENT:  Head: Normocephalic and atraumatic.  Cardiovascular: Normal rate, regular rhythm and normal heart sounds.   Pulmonary/Chest: Effort normal and breath sounds normal.  Neurological: She is alert and oriented to person, place, and time.  Psychiatric: She has a normal mood and affect. Her behavior is normal.          Assessment & Plan:  ADHD- due to continuing to be symptomatic not willing to give stimulant. Will give strattera. Taper discussed. Go online to get coupon. Pt was fairly adamant that she wanted Adderall "I want what works and that works". Long discussion about risk of this. Follow up in 2 months.   GAD- I do think her "jumping" is related to anxiety. discussed increase of celexa. She declined. Encouraged pt to consider counseling. I would also like to consider buspar but pt is not willing to be on anymore medications.   PVC's/atypical chest pain- discussed  increase of metoprolol. Pt declined today. Discussed trying PPI for treatment of potential GERD causing CP. Pt declined. She was encouraged to get echo that Dr. Jens Som ordered.   30 minutes was spent with patient and greater than 50 percent of visit spent counseling patient regarding not able to give stimulant.

## 2015-12-07 ENCOUNTER — Encounter: Payer: Self-pay | Admitting: Cardiology

## 2015-12-07 NOTE — Progress Notes (Signed)
This encounter was created in error - please disregard.

## 2015-12-19 ENCOUNTER — Ambulatory Visit (INDEPENDENT_AMBULATORY_CARE_PROVIDER_SITE_OTHER): Payer: Managed Care, Other (non HMO) | Admitting: Physician Assistant

## 2015-12-19 ENCOUNTER — Encounter: Payer: Self-pay | Admitting: Physician Assistant

## 2015-12-19 VITALS — BP 115/62 | HR 92 | Ht 62.0 in | Wt 141.0 lb

## 2015-12-19 DIAGNOSIS — R42 Dizziness and giddiness: Secondary | ICD-10-CM | POA: Diagnosis not present

## 2015-12-19 DIAGNOSIS — F902 Attention-deficit hyperactivity disorder, combined type: Secondary | ICD-10-CM

## 2015-12-19 DIAGNOSIS — R002 Palpitations: Secondary | ICD-10-CM

## 2015-12-19 DIAGNOSIS — I493 Ventricular premature depolarization: Secondary | ICD-10-CM

## 2015-12-19 DIAGNOSIS — E559 Vitamin D deficiency, unspecified: Secondary | ICD-10-CM | POA: Diagnosis not present

## 2015-12-19 LAB — GLUCOSE, POCT (MANUAL RESULT ENTRY): POC Glucose: 115 mg/dl — AB (ref 70–99)

## 2015-12-19 MED ORDER — ALPRAZOLAM 0.5 MG PO TABS: 0.5000 mg | ORAL_TABLET | Freq: Two times a day (BID) | ORAL | Status: DC | PRN

## 2015-12-19 NOTE — Progress Notes (Deleted)
   Subjective:    Patient ID: Nancy Olson, female    DOB: 14-Sep-1991, 25 y.o.   MRN: [id number]  HPI .      Review of Systems     Objective:   Physical Exam        Assessment & Plan:

## 2015-12-19 NOTE — Patient Instructions (Signed)
Start vitamin d tablet 50000 units weekly.  Increase toprol xL to  a day.  Decrease strattera to  a day. If still feeling tired and worn down after taking for next 2 weeks then stop strattera.  Get EcHo done that cardiology ordered.  Get TSH checked.

## 2015-12-19 NOTE — Progress Notes (Addendum)
   Subjective:    Patient ID: Nancy Olson, female    DOB: [birth year], 25 y.o.   MRN: [id number]  HPI  Patient is a 25 year old female presenting with palpitations that started at 2:00 AM this morning. Patient states that she felt anxious, confused, and tremulous since arriving at the office. Patient states that she has been compliant with strattera for ADHD. Despite this, she has felt increasingly impulse, fatigued, and has been engaging in binge eating. Patient denies purging. Patient states that she has had to leave work several days last week due to inattention and fatigue. She also had to leave early for lunch each day so that she can sleep extra during her lunch break. Patient states the only medicine that makes her feel better is Adderall, due to improved overall performance. Patient denies nausea. Patient denies illicit drug use.  Today she feels more dizzy after eating a "sugary scone". She denies any headache. She has not passed out. She does state she has not drank anything today.   Review of Systems    Please see HPI.  Objective:   Physical Exam  Constitutional: She appears well-developed and well-nourished.  HENT:  Head: Normocephalic and atraumatic.  Right Ear: External ear normal.  Left Ear: External ear normal.  Eyes: Conjunctivae and EOM are normal. Pupils are equal, round, and reactive to light.  Cardiovascular: Normal rate, regular rhythm, normal heart sounds and intact distal pulses.   No murmur heard. Pulmonary/Chest: Effort normal and breath sounds normal. No respiratory distress. She has no wheezes. She has no rales.  Abdominal: Soft. Bowel sounds are normal.  Neurological: She is alert. No cranial nerve deficit.  Skin: Skin is dry.  Psychiatric:  Patient seems excessively tearful during the exam. Patient's mood seems unstable. She is alert and oriented to time and place. Patient's thought process seems scattered and tangential.       Assessment & Plan:   1.  Palpitations/PVC's Patient was advised to undergo echocardiography as recommended by cardiology.per cardiology note he does not feel these are cardiac. Patient was also advised to complete blood work to assess TSH. metroprolol was increased to  daily. She does have xanax to use for anxiety when palpatitations do not resolve. Follow up in 1 month.   2. ADHD Patient's strattera was reduced to 40 mg per day to see if would help with fatigue.pt continues to request adderall. I discussed with patient I will not retry stimulant until palpitations are controlled. Patient will be advised to follow up if ADHD symptoms do not improve.   3. Binge Eating  Patient was advised to consult with psychiatry regarding episodes of binge eating. Patient was advised that binge eating is not a documented side effect of strattera. Pt feels like binge eating is a side effect of her ADHD being out of control.  Patient is resistant to consultation with psychiatry at this time.   4. Acute dizziness- random glucose was 115. Likely due to not having anything to drink today and some orthostatic hypotension. Encouraged hydration.   5. Vitamin d deficency- she has not started medication. Encouraged her to start medication.

## 2015-12-27 ENCOUNTER — Ambulatory Visit (INDEPENDENT_AMBULATORY_CARE_PROVIDER_SITE_OTHER): Payer: Managed Care, Other (non HMO) | Admitting: Physician Assistant

## 2015-12-27 ENCOUNTER — Encounter: Payer: Self-pay | Admitting: Physician Assistant

## 2015-12-27 ENCOUNTER — Telehealth: Payer: Self-pay | Admitting: Physician Assistant

## 2015-12-27 VITALS — BP 94/50 | HR 70 | Ht 62.0 in | Wt 140.0 lb

## 2015-12-27 DIAGNOSIS — R0789 Other chest pain: Secondary | ICD-10-CM | POA: Diagnosis not present

## 2015-12-27 DIAGNOSIS — Z0000 Encounter for general adult medical examination without abnormal findings: Secondary | ICD-10-CM

## 2015-12-27 DIAGNOSIS — I493 Ventricular premature depolarization: Secondary | ICD-10-CM | POA: Diagnosis not present

## 2015-12-27 DIAGNOSIS — L720 Epidermal cyst: Secondary | ICD-10-CM

## 2015-12-27 MED ORDER — MUPIROCIN 2 % EX OINT: TOPICAL_OINTMENT | CUTANEOUS | Status: DC

## 2015-12-27 NOTE — Telephone Encounter (Signed)
Need to call and get copy of her last pap.

## 2015-12-27 NOTE — Progress Notes (Addendum)
  Subjective:     Nancy Olson is a 25 y.o. female and is here for a comprehensive physical exam. Patient declines HIV or additional STD testing at this time. Patient states that she would like to schedule an additional appointment to discuss palpitations and depressive episodes. Patient requested paperwork to be completed for FMLA needed for the [employer] and her job. Patient has a history of abnormal pap smears.    Social History   Social History  . Marital Status: Single    Spouse Name: N/A  . Number of Children: N/A  . Years of Education: N/A   Occupational History  .      [employer]   Social History Main Topics  . Smoking status: Never Smoker   . Smokeless tobacco: Not on file  . Alcohol Use: No  . Drug Use: No  . Sexual Activity: Not Currently   Other Topics Concern  . Not on file   Social History Narrative   Health Maintenance  Topic Date Due  . HIV Screening  10/31/2006  . INFLUENZA VACCINE  06/05/2016  . PAP SMEAR  03/12/2017  . TETANUS/TDAP  03/12/2024    The following portions of the patient's history were reviewed and updated as appropriate: allergies, current medications, past family history, past medical history, past social history, past surgical history and problem list.  Review of Systems ROS revealed palpitations, nausea, anxiety, and depression  Objective:    General appearance: alert, cooperative, appears stated age and no distress Head: Normocephalic, without obvious abnormality, atraumatic, diffuse acne vulgaris beneath chin Eyes: conjunctivae/corneas clear. PERRL, EOM's intact. Fundi benign. Ears: normal TM's and external ear canals both ears Nose: Nares normal. Septum midline. Mucosa normal. No drainage or sinus tenderness. Throat: lips, mucosa, and tongue normal; teeth and gums normal Neck: no adenopathy, no carotid bruit, no JVD, supple, symmetrical, trachea midline and thyroid not enlarged, symmetric, no tenderness/mass/nodules Back:  symmetric, no curvature. ROM normal. No CVA tenderness. Lungs: clear to auscultation bilaterally Heart: regular rate and rhythm, S1, S2 normal, no murmur, click, rub or gallop Abdomen: soft, non-tender; bowel sounds normal; no masses,  no organomegaly Extremities: extremities normal, atraumatic, no cyanosis or edema. Left axilla small mobile cyst with puntate center, no drainage.  Pulses: 2+ and symmetric Skin: Skin color, texture, turgor normal. No rashes or lesions. Patient has striae visualized along arms bilaterally.  Lymph nodes: Cervical, supraclavicular, and axillary nodes normal. Neurologic: Grossly normal    Assessment:    Healthy female exam.     1. Abnormal paps: Patient has a history of abnormal paps. Patient's records are in the process of being obtained.  Plan:   Un[employer] PVC's-FMLA paperwork filled out for the next year. Scanned in for intermittent leave. On metoprolol and doing better. 2 episodes a week now.   Epidermal cyst left axilla- bactroban as needed. Discussed if continues to flare we could excise.   See After Visit Summary for Counseling Recommendations

## 2015-12-27 NOTE — Patient Instructions (Signed)

## 2016-02-10 ENCOUNTER — Ambulatory Visit (INDEPENDENT_AMBULATORY_CARE_PROVIDER_SITE_OTHER): Payer: Managed Care, Other (non HMO) | Admitting: Physician Assistant

## 2016-02-10 ENCOUNTER — Encounter: Payer: Self-pay | Admitting: Physician Assistant

## 2016-02-10 VITALS — BP 118/74 | HR 73 | Wt 145.0 lb

## 2016-02-10 DIAGNOSIS — F4325 Adjustment disorder with mixed disturbance of emotions and conduct: Secondary | ICD-10-CM

## 2016-02-10 DIAGNOSIS — I493 Ventricular premature depolarization: Secondary | ICD-10-CM

## 2016-02-10 DIAGNOSIS — F329 Major depressive disorder, single episode, unspecified: Secondary | ICD-10-CM

## 2016-02-10 DIAGNOSIS — F32A Depression, unspecified: Secondary | ICD-10-CM

## 2016-02-10 MED ORDER — METOPROLOL SUCCINATE ER 25 MG PO TB24: 25.0000 mg | ORAL_TABLET | Freq: Every day | ORAL | Status: DC

## 2016-02-10 MED ORDER — CITALOPRAM HYDROBROMIDE 20 MG PO TABS: 20.0000 mg | ORAL_TABLET | Freq: Every day | ORAL | Status: DC

## 2016-02-10 MED ORDER — ALPRAZOLAM 0.5 MG PO TABS: 0.5000 mg | ORAL_TABLET | Freq: Two times a day (BID) | ORAL | Status: DC | PRN

## 2016-02-10 NOTE — Progress Notes (Signed)
   Subjective:    Patient ID: Nancy Olson, female    DOB: [birth year], 25 y.o.   MRN: [id number]  HPI  Pt is a 25 yo female who presents to the clinic for depression. Pt's mother dided unexpectantly about 2 weeks ago. She had a routine knee replacement and was not given anti-coagulant and died from PE. She is very upset. She cries all the time. She still lives with her dad and to see him upset makes her upset as well. She has missed days of work and [employer] due to her depression. She is also out of her celexa. She has been out for a few weeks and has not had time to schedule another visit. She feels like PVC/s are worse but continues to take metoprolol and needs refill. She has not talked to anyone about what has happened.     Review of Systems See HPI.    Objective:   Physical Exam  Constitutional: She appears well-developed and well-nourished.  Cardiovascular: Normal rate, regular rhythm and normal heart sounds.   Pulmonary/Chest: Effort normal and breath sounds normal.  Psychiatric:  Tearful and emotional.           Assessment & Plan:  Depression/adjustment disorder- restart celexa and increase to 20mg  daily. Xanax as needed. Encouraged grief counseling. Pt states she will schedule.   PVC- refilled metoprolol.   Spent 30 minutes with patient and greater than 50 percent of visit spent counseling patient.

## 2016-02-10 NOTE — Patient Instructions (Signed)
Suggest grief counseling.

## 2016-02-13 DIAGNOSIS — F329 Major depressive disorder, single episode, unspecified: Secondary | ICD-10-CM | POA: Insufficient documentation

## 2016-02-13 DIAGNOSIS — F32A Depression, unspecified: Secondary | ICD-10-CM | POA: Insufficient documentation

## 2016-03-01 ENCOUNTER — Other Ambulatory Visit: Payer: Self-pay | Admitting: *Deleted

## 2016-03-01 DIAGNOSIS — R0789 Other chest pain: Secondary | ICD-10-CM

## 2016-03-01 DIAGNOSIS — R002 Palpitations: Secondary | ICD-10-CM

## 2016-03-01 DIAGNOSIS — I493 Ventricular premature depolarization: Secondary | ICD-10-CM

## 2016-03-12 ENCOUNTER — Other Ambulatory Visit: Payer: Self-pay | Admitting: Physician Assistant

## 2016-04-10 ENCOUNTER — Ambulatory Visit (INDEPENDENT_AMBULATORY_CARE_PROVIDER_SITE_OTHER): Payer: Managed Care, Other (non HMO) | Admitting: Physician Assistant

## 2016-04-10 ENCOUNTER — Encounter: Payer: Self-pay | Admitting: Physician Assistant

## 2016-04-10 VITALS — BP 106/58 | HR 59 | Ht 62.0 in | Wt 149.0 lb

## 2016-04-10 DIAGNOSIS — I493 Ventricular premature depolarization: Secondary | ICD-10-CM | POA: Diagnosis not present

## 2016-04-10 DIAGNOSIS — F331 Major depressive disorder, recurrent, moderate: Secondary | ICD-10-CM

## 2016-04-10 MED ORDER — METOPROLOL SUCCINATE ER 50 MG PO TB24: 50.0000 mg | ORAL_TABLET | Freq: Every day | ORAL | Status: AC

## 2016-04-10 NOTE — Progress Notes (Signed)
   Subjective:    Patient ID: Nancy Olson, female    DOB: 03/11/1991, 25 y.o.   MRN: [id number]  HPI  Patient is a 25 year old female with a past history of depression and PVCs. She's been evaluated by cardiologist. She is still not done her echo and stress test. She reports she is quite to this. She continues to have palpitations on a regular basis. She has had some dental work done this week and they were bad during the visit. Xanax at times does help with the palpitations. She is on a daily beta blocker. She continues to have depressive feelings. Her mom died back in April. She has not made it to grief counseling. She notices is something she needs to do. She often finds herself sad. She did increase her Celexa to 20 mg. She does feel like it helped some. She denies any suicidal or homicidal thoughts. She's been missing a lot of work off and on she just feels like she needs some time to get everything settled and start back full-time for good.    Review of Systems  All other systems reviewed and are negative.      Objective:   Physical Exam  Constitutional: She appears well-developed.  HENT:  Head: Normocephalic and atraumatic.  Cardiovascular: Normal rate, regular rhythm and normal heart sounds.   Pulmonary/Chest: Effort normal and breath sounds normal. She has no wheezes.  Psychiatric: She has a normal mood and affect. Her behavior is normal.          Assessment & Plan:  MDD/adjustment disorder- continue celexa daily. Encouraged patient to start grief counseling. Xanax only as needed. Written out until 6/19 to start back. If feels like needs to be out longer needs to follow up with psychiatry.   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 Increase metoprolol 50mg  daily. Written out for 2 weeks. During this time recommend get scans ordered for evaluation.   Filled out paperwork FMLA for 2 weeks.

## 2016-04-20 ENCOUNTER — Encounter: Payer: Self-pay | Admitting: Physician Assistant

## 2016-04-20 ENCOUNTER — Ambulatory Visit (INDEPENDENT_AMBULATORY_CARE_PROVIDER_SITE_OTHER): Payer: Managed Care, Other (non HMO) | Admitting: Physician Assistant

## 2016-04-20 VITALS — BP 109/53 | HR 80 | Ht 62.0 in | Wt 148.0 lb

## 2016-04-20 DIAGNOSIS — I493 Ventricular premature depolarization: Secondary | ICD-10-CM | POA: Diagnosis not present

## 2016-04-20 DIAGNOSIS — F329 Major depressive disorder, single episode, unspecified: Secondary | ICD-10-CM | POA: Diagnosis not present

## 2016-04-20 DIAGNOSIS — F32A Depression, unspecified: Secondary | ICD-10-CM

## 2016-04-20 DIAGNOSIS — F411 Generalized anxiety disorder: Secondary | ICD-10-CM | POA: Diagnosis not present

## 2016-04-20 NOTE — Progress Notes (Signed)
   Subjective:    Patient ID: Edgardo Roysraqia Nawaz, female    DOB: August 31, 1991, 25 y.o.   MRN: [id number]  HPI  Pt is a 25 yo female who presents to the clinic to follow up on reason for FMLA and being written out of work. She is taking metoprolol and feels like PVC's are much better. She occasionally will still have some episodes but much more managable. Her anxiety and depression has improved as well. She does feel like she can go back to work.    Review of Systems    see HPI>  Objective:   Physical Exam  Constitutional: She is oriented to person, place, and time. She appears well-developed and well-nourished.  HENT:  Head: Normocephalic and atraumatic.  Cardiovascular: Normal rate, regular rhythm and normal heart sounds.   Pulmonary/Chest: Effort normal and breath sounds normal. She has no wheezes.  Neurological: She is alert and oriented to person, place, and time.  Psychiatric: She has a normal mood and affect. Her behavior is normal.          Assessment & Plan:  Unifocal PVC's/Depression/GAD- note written to return to work on Monday June 19th. Continue on daily medications. Follow up in 3 months.

## 2016-05-09 ENCOUNTER — Telehealth: Payer: Self-pay

## 2016-05-09 NOTE — Telephone Encounter (Signed)
Ok to return to physical activity can put starting date at last office visit.

## 2016-05-09 NOTE — Telephone Encounter (Signed)
Nancy Olson called and wants to know if Nancy Olson believes it is ok for her to preform physical activity. She will need a note stating she can or cannot preform physical activity. Please advise.

## 2016-05-10 ENCOUNTER — Other Ambulatory Visit: Payer: Self-pay | Admitting: Physician Assistant

## 2016-05-10 MED ORDER — ATOMOXETINE HCL 40 MG PO CAPS: 80.0000 mg | ORAL_CAPSULE | Freq: Every day | ORAL | Status: DC

## 2016-05-10 NOTE — Telephone Encounter (Signed)
Printed letter. Patient is aware the letter is ready for pick up.

## 2016-06-29 ENCOUNTER — Encounter: Payer: Self-pay | Admitting: Physician Assistant

## 2016-06-29 ENCOUNTER — Ambulatory Visit (INDEPENDENT_AMBULATORY_CARE_PROVIDER_SITE_OTHER): Payer: Managed Care, Other (non HMO) | Admitting: Physician Assistant

## 2016-06-29 VITALS — BP 102/67 | HR 83 | Ht 62.0 in | Wt 155.0 lb

## 2016-06-29 DIAGNOSIS — L680 Hirsutism: Secondary | ICD-10-CM | POA: Diagnosis not present

## 2016-06-29 DIAGNOSIS — R002 Palpitations: Secondary | ICD-10-CM | POA: Diagnosis not present

## 2016-06-29 DIAGNOSIS — F331 Major depressive disorder, recurrent, moderate: Secondary | ICD-10-CM

## 2016-06-29 DIAGNOSIS — I493 Ventricular premature depolarization: Secondary | ICD-10-CM

## 2016-06-29 MED ORDER — SPIRONOLACTONE 50 MG PO TABS: 50.0000 mg | ORAL_TABLET | Freq: Two times a day (BID) | ORAL | 2 refills | Status: AC

## 2016-06-29 MED ORDER — BUPROPION HCL ER (XL) 300 MG PO TB24: 300.0000 mg | ORAL_TABLET | Freq: Every day | ORAL | 2 refills | Status: AC

## 2016-06-29 NOTE — Progress Notes (Signed)
   Subjective:    Patient ID: Nancy Olson, female    DOB: June 25, 1991, 25 y.o.   MRN: [id number]  HPI  Patient is a 25 year old female who presents to the clinic to have paperwork filled out for work. Over the past 6 weeks she stopped her Celexa because she felt like it was making her gain weight and she didn't like the weight was making her feel. She didn't feel like she noticed it right away but seems like it has been making her depression worse. She just doesn't care. She has not gone to work since 06/13/16. She denies any suicidal thoughts. She is also sleep and stay home. She had awakened call yesterday and feels like she has to go back to work. She no she needs to get help. She has made an appointment with psychiatry and psychology through the [employer] [facility]. Patient did have her mother died in April of tissue. Things have not been the same since this. She agrees she has not dealt with the death well. She has not been to any counseling sessions.  PVCs-she does admit to stopping metoprolol and her PVCs, back more. She will restart metoprolol. There were doing well on the current dose.  She would like something to treat her hair growth around her chin and jawline. She has not tried anything.    Review of Systems    see HPI.  Objective:   Physical Exam  Constitutional: She is oriented to person, place, and time. She appears well-developed and well-nourished.  HENT:  Head: Normocephalic and atraumatic.  Cardiovascular: Normal rate, regular rhythm and normal heart sounds.   Pulmonary/Chest: Effort normal and breath sounds normal.  Neurological: She is alert and oriented to person, place, and time.  Psychiatric: She has a normal mood and affect. Her behavior is normal.          Assessment & Plan:  MDD- discussed medication options. She does not want to gain weight. She stopped celexa because she felt like it was. Added wellbutrin. Discussed side effects. Needs referral.   Written out  from 06/13/16-07/01/16. Intermittent 1-2 days every 30 days for mental health reasons. Pt has appt with psychiatrist and psychologist per patient through [employer] [facility].   Hirsutism- sent spirolactone. Discussed can also lower BP.   Unifocal PVC's/palpitations- restart metoprolol. Filled out paperwork to be able to go back to [employer] duty and physical activity.   Filled out paperwork for 30 minutes.

## 2016-11-19 IMAGING — CR DG CHEST 2V
2 series · 2 of 2 positions shown · non-contrast
Comparison: None.

CLINICAL DATA: Chest pain

EXAM:
CHEST  2 VIEW

[chest pa]
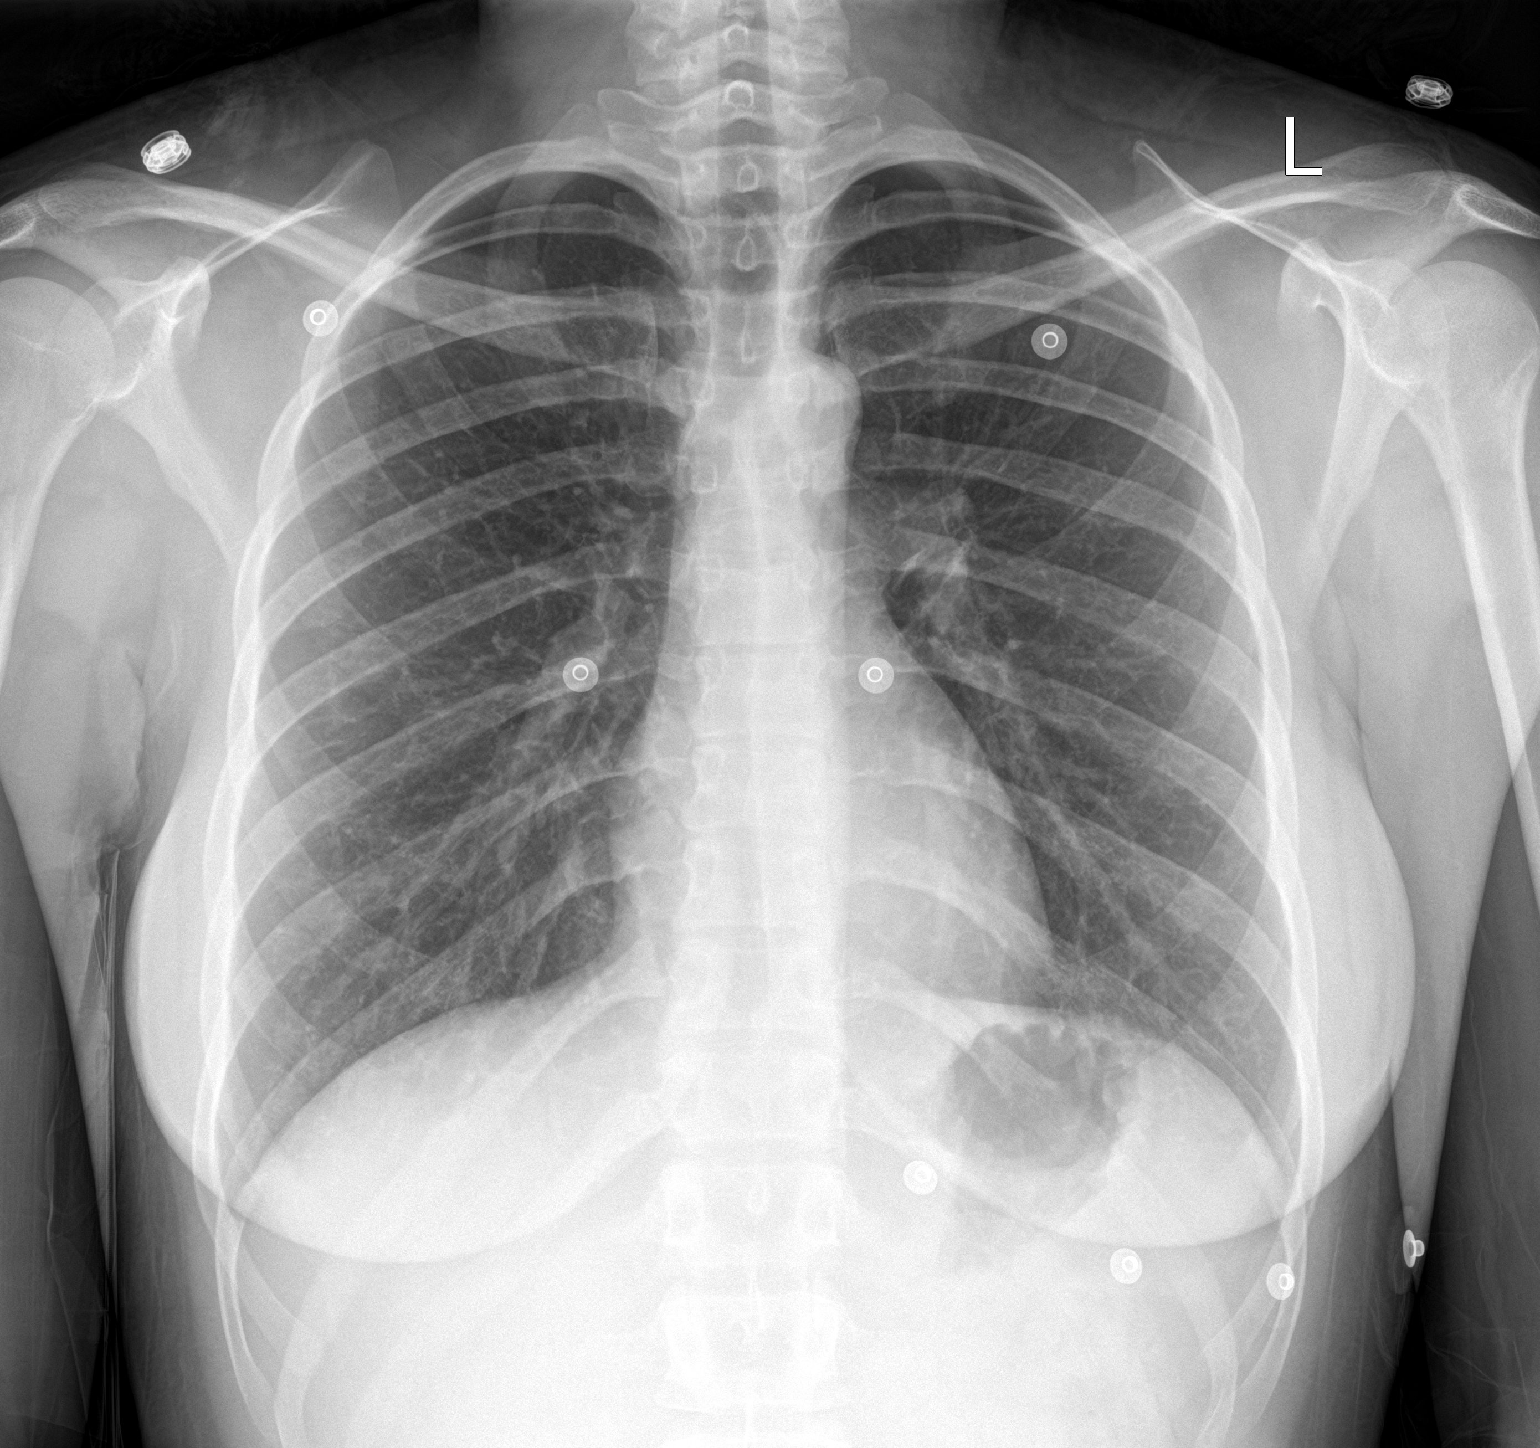

[chest lat]
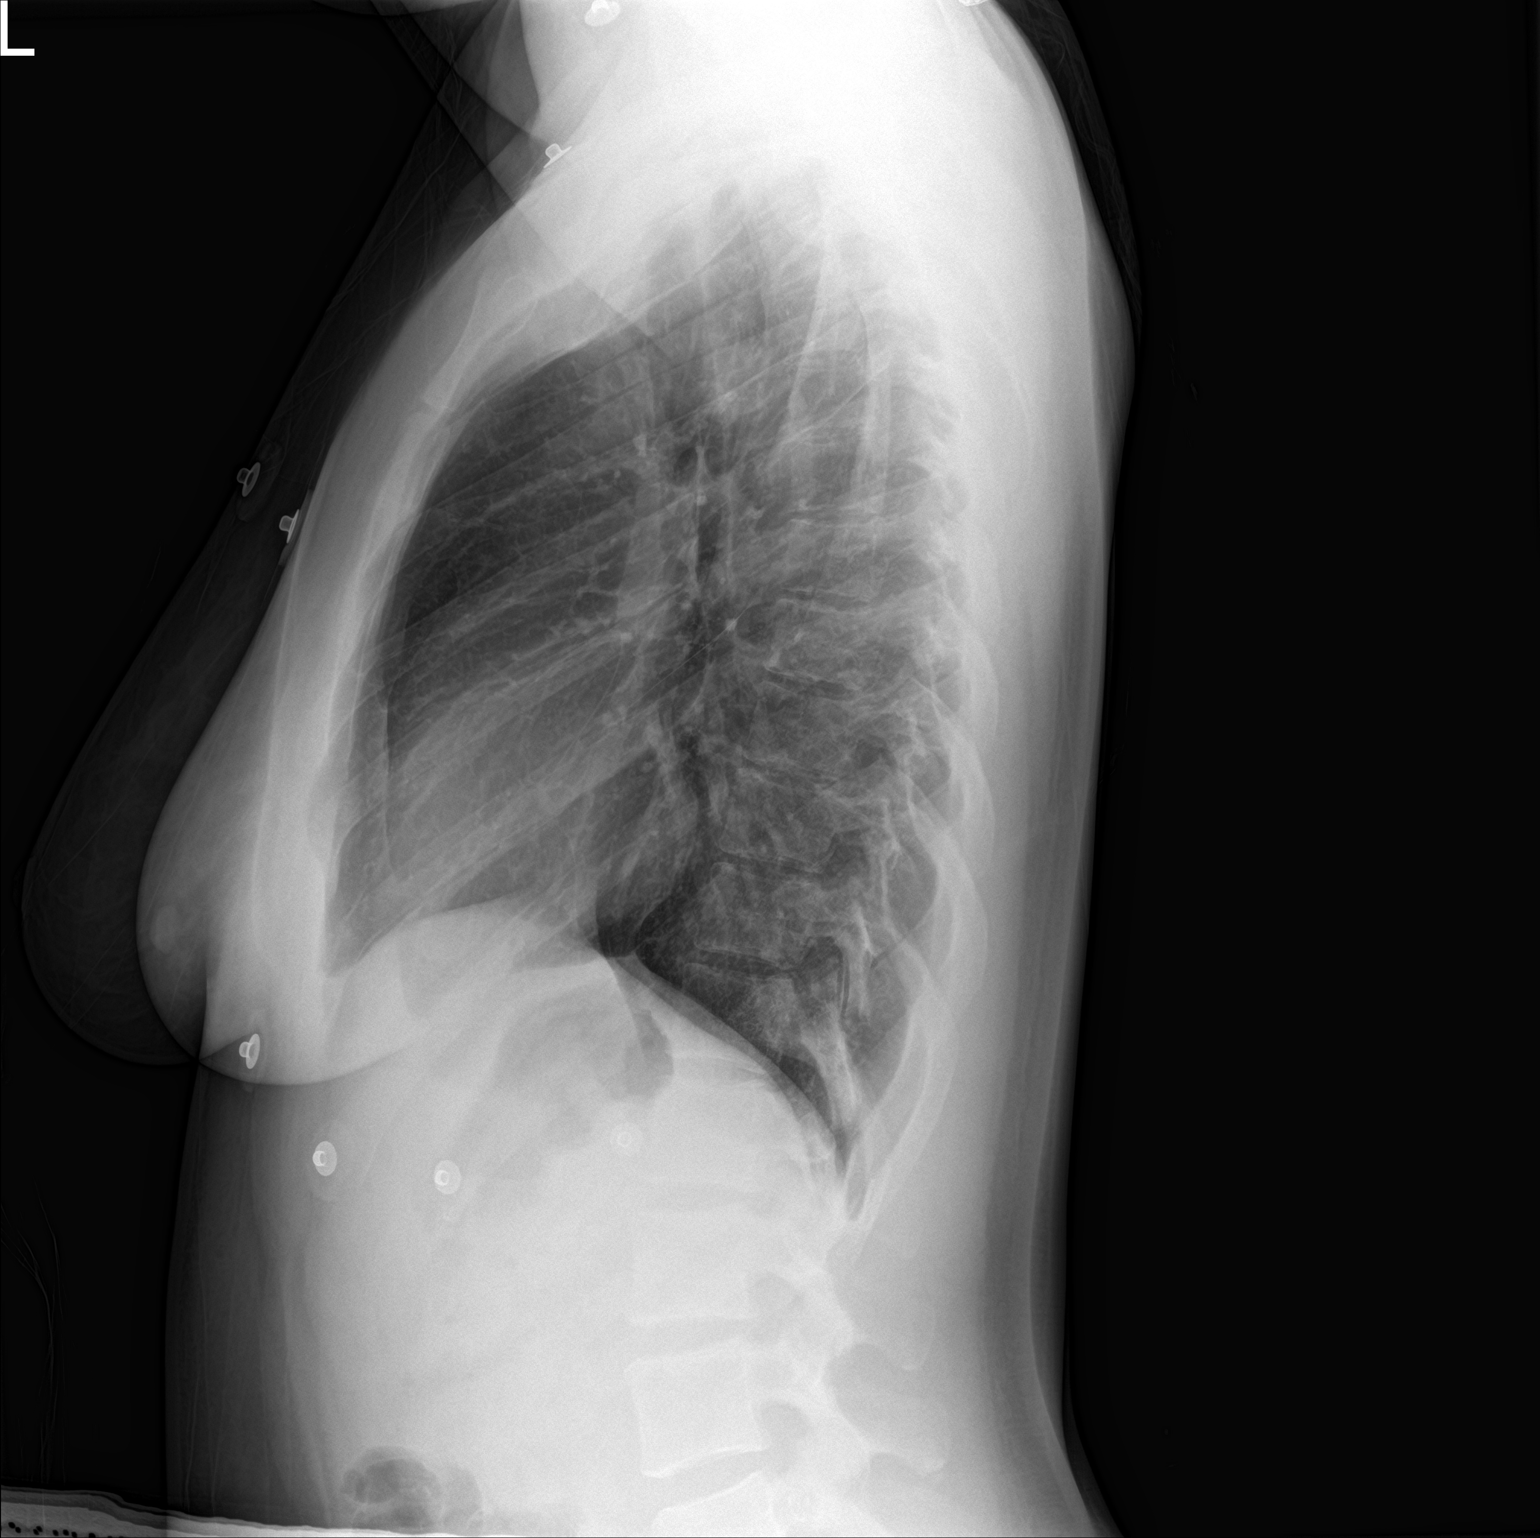

[2 of 2 positions shown; findings below may reference images not displayed]

FINDINGS: The heart size and mediastinal contours are within normal limits.
Both lungs are clear. The visualized skeletal structures are
unremarkable.
IMPRESSION: No active cardiopulmonary disease.

## 2016-12-12 ENCOUNTER — Encounter: Payer: Self-pay | Admitting: Physician Assistant
